# Patient Record
Sex: Male | Born: 1995 | Race: Black or African American | Hispanic: No | Marital: Single
Health system: Southern US, Community
[De-identification: ages and names within clinical notes are randomized; demographics above are authoritative.]

## PROBLEM LIST (undated history)

## (undated) DIAGNOSIS — E669 Obesity, unspecified: Secondary | ICD-10-CM

## (undated) DIAGNOSIS — J45909 Unspecified asthma, uncomplicated: Secondary | ICD-10-CM

---
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
## 2020-07-08 ENCOUNTER — Inpatient Hospital Stay (HOSPITAL_COMMUNITY)
Admission: EM | Admit: 2020-07-08 | Discharge: 2020-07-10 | DRG: 177 | Disposition: A | Discharge status: Home / Self Care | Payer: HRSA Program | Attending: Family Medicine | Admitting: Family Medicine

## 2020-07-08 ENCOUNTER — Emergency Department (HOSPITAL_COMMUNITY): Payer: HRSA Program

## 2020-07-08 ENCOUNTER — Encounter (HOSPITAL_COMMUNITY): Payer: Self-pay | Admitting: Emergency Medicine

## 2020-07-08 ENCOUNTER — Other Ambulatory Visit: Payer: Self-pay

## 2020-07-08 DIAGNOSIS — Z6841 Body Mass Index (BMI) 40.0 and over, adult: Secondary | ICD-10-CM

## 2020-07-08 DIAGNOSIS — R7989 Other specified abnormal findings of blood chemistry: Secondary | ICD-10-CM

## 2020-07-08 DIAGNOSIS — J9691 Respiratory failure, unspecified with hypoxia: Secondary | ICD-10-CM | POA: Diagnosis present

## 2020-07-08 DIAGNOSIS — U071 COVID-19: Secondary | ICD-10-CM | POA: Diagnosis not present

## 2020-07-08 DIAGNOSIS — E119 Type 2 diabetes mellitus without complications: Secondary | ICD-10-CM | POA: Diagnosis present

## 2020-07-08 DIAGNOSIS — R079 Chest pain, unspecified: Secondary | ICD-10-CM

## 2020-07-08 DIAGNOSIS — M79604 Pain in right leg: Secondary | ICD-10-CM

## 2020-07-08 DIAGNOSIS — I10 Essential (primary) hypertension: Secondary | ICD-10-CM | POA: Diagnosis present

## 2020-07-08 DIAGNOSIS — J40 Bronchitis, not specified as acute or chronic: Secondary | ICD-10-CM | POA: Diagnosis present

## 2020-07-08 DIAGNOSIS — R0902 Hypoxemia: Secondary | ICD-10-CM

## 2020-07-08 DIAGNOSIS — J45901 Unspecified asthma with (acute) exacerbation: Secondary | ICD-10-CM | POA: Diagnosis present

## 2020-07-08 DIAGNOSIS — E782 Mixed hyperlipidemia: Secondary | ICD-10-CM | POA: Diagnosis present

## 2020-07-08 DIAGNOSIS — J9601 Acute respiratory failure with hypoxia: Secondary | ICD-10-CM | POA: Diagnosis present

## 2020-07-08 DIAGNOSIS — Z8249 Family history of ischemic heart disease and other diseases of the circulatory system: Secondary | ICD-10-CM

## 2020-07-08 HISTORY — DX: Unspecified asthma, uncomplicated: J45.909

## 2020-07-08 HISTORY — DX: Obesity, unspecified: E66.9

## 2020-07-08 LAB — CBC WITH DIFFERENTIAL/PLATELET
Abs Immature Granulocytes: 0.08 10*3/uL — ABNORMAL HIGH (ref 0.00–0.07)
Basophils Absolute: 0 10*3/uL (ref 0.0–0.1)
Basophils Relative: 0 %
Eosinophils Absolute: 0.1 10*3/uL (ref 0.0–0.5)
Eosinophils Relative: 1 %
HCT: 41.8 % (ref 39.0–52.0)
Hemoglobin: 13.2 g/dL (ref 13.0–17.0)
Immature Granulocytes: 1 %
Lymphocytes Relative: 10 %
Lymphs Abs: 1 10*3/uL (ref 0.7–4.0)
MCH: 25.8 pg — ABNORMAL LOW (ref 26.0–34.0)
MCHC: 31.6 g/dL (ref 30.0–36.0)
MCV: 81.8 fL (ref 80.0–100.0)
Monocytes Absolute: 0.2 10*3/uL (ref 0.1–1.0)
Monocytes Relative: 2 %
Neutro Abs: 8.6 10*3/uL — ABNORMAL HIGH (ref 1.7–7.7)
Neutrophils Relative %: 86 %
Platelets: 352 10*3/uL (ref 150–400)
RBC: 5.11 MIL/uL (ref 4.22–5.81)
RDW: 15 % (ref 11.5–15.5)
WBC: 10.1 10*3/uL (ref 4.0–10.5)
nRBC: 0 % (ref 0.0–0.2)

## 2020-07-08 LAB — BASIC METABOLIC PANEL
Anion gap: 9 (ref 5–15)
BUN: 16 mg/dL (ref 6–20)
CO2: 23 mmol/L (ref 22–32)
Calcium: 9.7 mg/dL (ref 8.9–10.3)
Chloride: 100 mmol/L (ref 98–111)
Creatinine, Ser: 0.92 mg/dL (ref 0.61–1.24)
GFR, Estimated: >60 mL/min (ref >60)
Glucose, Bld: 134 mg/dL — ABNORMAL HIGH (ref 70–99)
Potassium: 4.1 mmol/L (ref 3.5–5.1)
Sodium: 132 mmol/L — ABNORMAL LOW (ref 135–145)

## 2020-07-08 LAB — RESPIRATORY PANEL BY RT PCR (FLU A&B, COVID)
Influenza A by PCR: NEGATIVE
Influenza B by PCR: NEGATIVE
SARS Coronavirus 2 by RT PCR: POSITIVE — AB

## 2020-07-08 LAB — TROPONIN I (HIGH SENSITIVITY)
Troponin I (High Sensitivity): <2 ng/L (ref <18)
Troponin I (High Sensitivity): <2 ng/L (ref <18)

## 2020-07-08 MED ORDER — MAGNESIUM SULFATE 2 GM/50ML IV SOLN
2.0000 g | Freq: Once | INTRAVENOUS | Status: AC
  Administered 2020-07-08: 2 g via INTRAVENOUS
  Filled 2020-07-08: qty 50

## 2020-07-08 MED ORDER — PREDNISONE 50 MG PO TABS
60.0000 mg | ORAL_TABLET | Freq: Once | ORAL | Status: AC
  Administered 2020-07-08: 60 mg via ORAL
  Filled 2020-07-08: qty 1

## 2020-07-08 MED ORDER — METHYLPREDNISOLONE SODIUM SUCC 125 MG IJ SOLR
125.0000 mg | Freq: Once | INTRAMUSCULAR | Status: AC
  Administered 2020-07-08: 125 mg via INTRAVENOUS
  Filled 2020-07-08: qty 2

## 2020-07-08 MED ORDER — ALBUTEROL SULFATE HFA 108 (90 BASE) MCG/ACT IN AERS
2.0000 | INHALATION_SPRAY | Freq: Once | RESPIRATORY_TRACT | Status: AC
  Administered 2020-07-08: 2 via RESPIRATORY_TRACT
  Filled 2020-07-08: qty 6.7

## 2020-07-08 MED ORDER — IOHEXOL 350 MG/ML SOLN
100.0000 mL | Freq: Once | INTRAVENOUS | Status: AC | PRN
  Administered 2020-07-08: 100 mL via INTRAVENOUS

## 2020-07-08 MED ORDER — AEROCHAMBER Z-STAT PLUS/MEDIUM MISC
Status: AC
  Filled 2020-07-08: qty 1

## 2020-07-08 NOTE — ED Triage Notes (Signed)
C/o chest congestion and flu-like symptoms.  Was positive for COVID October 3rd.  Pt has not been vaccinated.

## 2020-07-08 NOTE — ED Notes (Signed)
Date and time results received: 07/08/20  (use smartphrase ".now" to insert current time)  Test: covid Critical Value: positive  Name of Provider Notified: Debarah Crape, [place]   Orders Received? Or Actions Taken?:

## 2020-07-08 NOTE — ED Notes (Signed)
pts o2 sats 95% on RA at rest. Pt sats 96% on RA while ambulating.

## 2020-07-08 NOTE — ED Provider Notes (Signed)
[facility] EMERGENCY DEPARTMENT Provider Note   CSN: [id number] Arrival date & time: 07/08/20  1241     History Chief Complaint  Patient presents with  . Covid Positive    Ryan Marquez is a 24 y.o. male with history of elevated BMI 50, pediatric asthma presents to the ED for evaluation of chest pain.  Reports "lungs hurt".  Central. Began last night.  Constant.  Worse with taking deep breaths, coughing, moving, exertion.  Has associated runny nose, productive cough, shortness of breath, wheezing.  Feels some mucus occasionally building up in his throat.  Reports chronic shortness of breath that he attributes to his weight but feels like this is worse as well last night.  Had a hard time falling asleep because he felt like his breathing was heavier when he laid down.  No interventions.  Reports having COVID-19 the beginning of October.  Reports having all the Covid symptoms but then they resolved.  Felt like they came back again suddenly last night.  No sick contacts.  No travel.  Denies exertional chest pain.  No nausea, vomiting, abdominal pain, diarrhea.  Reports last time he had asthma issues was when he was in elementary school.  Stopped vaping a couple of weeks ago.  HPI     Past Medical History:  Diagnosis Date  . Asthma   . Obese     There are no problems to display for this patient.   ** The histories are not reviewed yet. Please review them in the "History" navigator section and refresh this SmartLink.     No family history on file.  Social History   Tobacco Use  . Smoking status: Not on file  Substance Use Topics  . Alcohol use: Not on file  . Drug use: Not on file    Home Medications Prior to Admission medications   Not on File    Allergies    Patient has no known allergies.  Review of Systems   Review of Systems  HENT: Positive for congestion and rhinorrhea.   Respiratory: Positive for cough, chest tightness, shortness of breath and wheezing.     Cardiovascular: Positive for chest pain.  All other systems reviewed and are negative.   Physical Exam Updated Vital Signs BP (!) 152/95   Pulse 94   Temp 98.8 F (37.1 C) (Oral)   Resp 20   Ht 5\' 8"  (1.727 m)   Wt (!) 149.7 kg   SpO2 90%   BMI 50.18 kg/m   Physical Exam Vitals and nursing note reviewed.  Constitutional:      General: He is not in acute distress.    Appearance: He is well-developed.     Comments: NAD.  HENT:     Head: Normocephalic and atraumatic.     Right Ear: External ear normal.     Left Ear: External ear normal.     Nose: Congestion and rhinorrhea present.  Eyes:     General: No scleral icterus.    Conjunctiva/sclera: Conjunctivae normal.  Cardiovascular:     Rate and Rhythm: Normal rate and regular rhythm.     Heart sounds: Normal heart sounds. No murmur heard.   Pulmonary:     Effort: Pulmonary effort is normal.     Breath sounds: Wheezing present.     Comments: Normal work of breathing.  Speaking in full sentences.  Wheezing in upper and middle lobes.  Difficult exam in lower lobes given body habitus.  No crackles. Abdominal:  Palpations: Abdomen is soft.     Tenderness: There is no abdominal tenderness.  Musculoskeletal:        General: Normal range of motion.     Cervical back: Normal range of motion and neck supple.  Skin:    General: Skin is warm and dry.     Capillary Refill: Capillary refill takes less than 2 seconds.  Neurological:     Mental Status: He is alert and oriented to person, place, and time.  Psychiatric:        Behavior: Behavior normal.        Thought Content: Thought content normal.        Judgment: Judgment normal.     ED Results / Procedures / Treatments   Labs (all labs ordered are listed, but only abnormal results are displayed) Labs Reviewed  RESPIRATORY PANEL BY RT PCR (FLU A&B, COVID) - Abnormal; Notable for the following components:      Result Value   SARS Coronavirus 2 by RT PCR POSITIVE (*)     All other components within normal limits  CBC WITH DIFFERENTIAL/PLATELET - Abnormal; Notable for the following components:   MCH 25.8 (*)    Neutro Abs 8.6 (*)    Abs Immature Granulocytes 0.08 (*)    All other components within normal limits  BASIC METABOLIC PANEL - Abnormal; Notable for the following components:   Sodium 132 (*)    Glucose, Bld 134 (*)    All other components within normal limits  TROPONIN I (HIGH SENSITIVITY)  TROPONIN I (HIGH SENSITIVITY)    EKG None  Radiology DG Chest 2 View  Result Date: 07/08/2020 CLINICAL DATA:  Cough and chest congestion. Previous coronavirus infection. EXAM: CHEST - 2 VIEW COMPARISON:  06/05/2008 FINDINGS: Heart size upper limits of normal. Mediastinal shadows are normal. Mild atelectasis or scarring at the right lung base. Bronchial thickening pattern suggesting bronchitis. No consolidation or lobar collapse. No effusions. No significant finding. IMPRESSION: Bronchial thickening pattern suggesting bronchitis. Mild atelectasis or scarring at the right lung base. Electronically Signed   By: Paulina FusiMark  Shogry M.D.   On: 07/08/2020 14:19   CT Angio Chest PE W and/or Wo Contrast  Result Date: 07/08/2020 CLINICAL DATA:  Tachycardia, cough, COVID-19 diagnosed 05/31/2020 EXAM: CT ANGIOGRAPHY CHEST WITH CONTRAST TECHNIQUE: Multidetector CT imaging of the chest was performed using the standard protocol during bolus administration of intravenous contrast. Multiplanar CT image reconstructions and MIPs were obtained to evaluate the vascular anatomy. CONTRAST:  100mL OMNIPAQUE IOHEXOL 350 MG/ML SOLN COMPARISON:  07/08/2020 FINDINGS: Cardiovascular: This is a technically suboptimal evaluation of the pulmonary vasculature due to poor intravenous access and limitations with contrast bolus. Pulmonary emboli cannot be excluded. The heart is unremarkable without pericardial effusion. Normal caliber of the thoracic aorta. Mediastinum/Nodes: No enlarged mediastinal,  hilar, or axillary lymph nodes. Thyroid gland, trachea, and esophagus demonstrate no significant findings. Lungs/Pleura: No airspace disease, effusion, or pneumothorax. Mild bronchial wall thickening. Central airways are patent. Upper Abdomen: No acute abnormality. Musculoskeletal: No acute or destructive bony lesions. Reconstructed images demonstrate no additional findings. Review of the MIP images confirms the above findings. IMPRESSION: 1. Nondiagnostic evaluation of the pulmonary arteries due to limitations of contrast bolus. Pulmonary emboli cannot be excluded. 2. Mild bilateral bronchial wall thickening consistent with reactive airway disease. No acute airspace disease. Electronically Signed   By: Sharlet SalinaMichael  Brown M.D.   On: 07/08/2020 22:51    Procedures .Critical Care Performed by: Liberty HandyGibbons, Emeli Goguen J, PA-C Authorized by: Margarette AsalGibbons,  Delia Heady, PA-C   Critical care provider statement:    Critical care time (minutes):  45   Critical care was necessary to treat or prevent imminent or life-threatening deterioration of the following conditions:  Respiratory failure   Critical care was time spent personally by me on the following activities:  Discussions with consultants, evaluation of patient's response to treatment, examination of patient, ordering and performing treatments and interventions, ordering and review of laboratory studies, ordering and review of radiographic studies, pulse oximetry, re-evaluation of patient's condition, obtaining history from patient or surrogate, review of old charts and development of treatment plan with patient or surrogate   I assumed direction of critical care for this patient from another provider in my specialty: no     (including critical care time)  Medications Ordered in ED Medications  aerochamber Z-Stat Plus/medium (  Not Given 07/08/20 1840)  albuterol (VENTOLIN HFA) 108 (90 Base) MCG/ACT inhaler 2 puff (2 puffs Inhalation Given 07/08/20 1522)  predniSONE  (DELTASONE) tablet 60 mg (60 mg Oral Given 07/08/20 1520)  methylPREDNISolone sodium succinate (SOLU-MEDROL) 125 mg/2 mL injection 125 mg (125 mg Intravenous Given 07/08/20 1831)  magnesium sulfate IVPB 2 g 50 mL (0 g Intravenous Stopped 07/08/20 1929)  iohexol (OMNIPAQUE) 350 MG/ML injection 100 mL (100 mLs Intravenous Contrast Given 07/08/20 2207)    ED Course  I have reviewed the triage vital signs and the nursing notes.  Pertinent labs & imaging results that were available during my care of the patient were reviewed by me and considered in my medical decision making (see chart for details).  Clinical Course as of Jul 08 2312  Wed Jul 08, 2020  1444 IMPRESSION: Bronchial thickening pattern suggesting bronchitis. Mild atelectasis or scarring at the right lung base.  DG Chest 2 View [CG]  1659 Reevaluated patient after albuterol treatment, prednisone.  Reports continued chest pain.  Patient was 88% on room air at rest/laying down.  I ambulated him for 1 minute and his heart rate went up to 126, SPO2 dropped to 88% but quickly improved after rest to greater than 94%.  Patient reports feeling lightheaded.  Given continued chest pain, exertional tachycardia, transient hypoxia will start lab work, get CTA   [CG]  2306 IMPRESSION: 1. Nondiagnostic evaluation of the pulmonary arteries due to limitations of contrast bolus. Pulmonary emboli cannot be excluded. 2. Mild bilateral bronchial wall thickening consistent with reactive airway disease. No acute airspace disease.  CT Angio Chest PE W and/or Wo Contrast [CG]    Clinical Course User Index [CG] Jerrell Mylar   MDM Rules/Calculators/A&P                          24 year old male with history of pediatric asthma, BMI 50 and reported Covid diagnosis first week of October here for upper respiratory symptoms as well as central chest pain worse with deep breathing, coughing, moving, wheezing.  Reports Covid symptoms had completely  resolved last month.  Symptoms returned. No history of blood clots.  Has not had an asthma flare since elementary school.  On exam he has very faint wheezing, tachycardia 110s.  Afebrile.   ER work-up initiated in triage including chest x-ray and respiratory panel.    Chest x-ray shows bronchial thickening suggesting bronchitis with possible atelectasis or scarring in the right lung base.  Respiratory panel positive for Covid which was anticipated given diagnosis just last month.  Patient given albuterol inhaler, prednisone.  Will reassess, ambulate with pulse ox .  No clinical improvement after meds.  I personally ambulated patient and his heart rate went up to 126 and he had very brief transient hypoxia 88% at the end of 1 minute walk, this quickly improved after rest however patient felt very lightheaded.  Tachypneic in the mid 20s.  Wheezing did improve.  Given tachycardia, transient hypoxia, tachypnea and upper respiratory symptoms with chest pain that is pleuritic, will now add lab work.  Will obtain CT A to rule out a small pneumonia missed on chest x-ray, PE.  Labs ordered: CBC, BMP, troponin.  Imaging ordered: CTA, EKG  Meds ordered: solumedrol, mag, albuterol  ER work up personally visualized and interpreted  Labs reviewed - No leukocytosis, anemia.  Normal electrolytes. +SARS. Trop undetectable, pending delta.   Imaging reviewed- EKG with sinus tachycardia HR 112, possible LVH but no ischemia, arrhythmia, RV strain.   1915: RN notified me patient dropped down to 86% on RA placed on 2 L  with improvement in work of breathing. Pending labs, CTA.   2308: Significant delay in work-up in the ED due to several failed IV attempts, infiltrated during CTA as well and fifth ultrasound IV placed by RN finally.  Unfortunately timing of contract is poor.  CTA limited due to contrast timing, shows reactive airway disease but no large PE, infiltrate.  Clinically patient presents with  cough that is productive, runny nose, wheezing, chest pain that is pleuritic and posttussive.  Wheezing on my initial exam but improved with breathing treatments, prednisone, magnesium.  Initially tachycardic, tachypneic and hypoxic 86% on room air, stable now on 2 LNC with no clinical decline. Highest on ddx is viral illness leading to RAD/asthma flare.  No fever, leukocytosis at this time will defer antibiotics. Will request admission.   Final Clinical Impression(s) / ED Diagnoses Final diagnoses:  Hypoxia    Rx / DC Orders ED Discharge Orders    None       Liberty Handy, PA-C 07/08/20 2313    Gerhard Munch, MD 07/10/20 [phone]

## 2020-07-08 NOTE — ED Notes (Signed)
Pt placed on 2L [place]

## 2020-07-09 ENCOUNTER — Observation Stay (HOSPITAL_COMMUNITY): Payer: HRSA Program

## 2020-07-09 ENCOUNTER — Observation Stay: Payer: Self-pay

## 2020-07-09 DIAGNOSIS — R0902 Hypoxemia: Secondary | ICD-10-CM

## 2020-07-09 DIAGNOSIS — E119 Type 2 diabetes mellitus without complications: Secondary | ICD-10-CM

## 2020-07-09 DIAGNOSIS — J45901 Unspecified asthma with (acute) exacerbation: Secondary | ICD-10-CM | POA: Diagnosis present

## 2020-07-09 DIAGNOSIS — R7989 Other specified abnormal findings of blood chemistry: Secondary | ICD-10-CM | POA: Diagnosis not present

## 2020-07-09 DIAGNOSIS — I10 Essential (primary) hypertension: Secondary | ICD-10-CM | POA: Diagnosis present

## 2020-07-09 DIAGNOSIS — R079 Chest pain, unspecified: Secondary | ICD-10-CM | POA: Diagnosis present

## 2020-07-09 DIAGNOSIS — U071 COVID-19: Secondary | ICD-10-CM | POA: Diagnosis present

## 2020-07-09 DIAGNOSIS — J9601 Acute respiratory failure with hypoxia: Secondary | ICD-10-CM | POA: Diagnosis present

## 2020-07-09 DIAGNOSIS — Z8249 Family history of ischemic heart disease and other diseases of the circulatory system: Secondary | ICD-10-CM | POA: Diagnosis not present

## 2020-07-09 DIAGNOSIS — Z6841 Body Mass Index (BMI) 40.0 and over, adult: Secondary | ICD-10-CM | POA: Diagnosis not present

## 2020-07-09 DIAGNOSIS — J40 Bronchitis, not specified as acute or chronic: Secondary | ICD-10-CM | POA: Diagnosis present

## 2020-07-09 DIAGNOSIS — E782 Mixed hyperlipidemia: Secondary | ICD-10-CM | POA: Diagnosis present

## 2020-07-09 LAB — COMPREHENSIVE METABOLIC PANEL
ALT: 32 U/L (ref 0–44)
AST: 23 U/L (ref 15–41)
Albumin: 4.3 g/dL (ref 3.5–5.0)
Alkaline Phosphatase: 83 U/L (ref 38–126)
Anion gap: 12 (ref 5–15)
BUN: 19 mg/dL (ref 6–20)
CO2: 22 mmol/L (ref 22–32)
Calcium: 9.6 mg/dL (ref 8.9–10.3)
Chloride: 100 mmol/L (ref 98–111)
Creatinine, Ser: 0.99 mg/dL (ref 0.61–1.24)
GFR, Estimated: >60 mL/min (ref >60)
Glucose, Bld: 233 mg/dL — ABNORMAL HIGH (ref 70–99)
Potassium: 4.7 mmol/L (ref 3.5–5.1)
Sodium: 134 mmol/L — ABNORMAL LOW (ref 135–145)
Total Bilirubin: 0.5 mg/dL (ref 0.3–1.2)
Total Protein: 8.6 g/dL — ABNORMAL HIGH (ref 6.5–8.1)

## 2020-07-09 LAB — GLUCOSE, CAPILLARY
Glucose-Capillary: 153 mg/dL — ABNORMAL HIGH (ref 70–99)
Glucose-Capillary: 154 mg/dL — ABNORMAL HIGH (ref 70–99)
Glucose-Capillary: 157 mg/dL — ABNORMAL HIGH (ref 70–99)
Glucose-Capillary: 139 mg/dL — ABNORMAL HIGH (ref 70–99)

## 2020-07-09 LAB — HIV ANTIBODY (ROUTINE TESTING W REFLEX): HIV Screen 4th Generation wRfx: NONREACTIVE

## 2020-07-09 LAB — CBC WITH DIFFERENTIAL/PLATELET
Abs Immature Granulocytes: 0.06 10*3/uL (ref 0.00–0.07)
Basophils Absolute: 0 10*3/uL (ref 0.0–0.1)
Basophils Relative: 0 %
Eosinophils Absolute: 0 10*3/uL (ref 0.0–0.5)
Eosinophils Relative: 0 %
HCT: 40.5 % (ref 39.0–52.0)
Hemoglobin: 12.8 g/dL — ABNORMAL LOW (ref 13.0–17.0)
Immature Granulocytes: 1 %
Lymphocytes Relative: 11 %
Lymphs Abs: 1.2 10*3/uL (ref 0.7–4.0)
MCH: 26.2 pg (ref 26.0–34.0)
MCHC: 31.6 g/dL (ref 30.0–36.0)
MCV: 82.8 fL (ref 80.0–100.0)
Monocytes Absolute: 0.3 10*3/uL (ref 0.1–1.0)
Monocytes Relative: 2 %
Neutro Abs: 9.2 10*3/uL — ABNORMAL HIGH (ref 1.7–7.7)
Neutrophils Relative %: 86 %
Platelets: 368 10*3/uL (ref 150–400)
RBC: 4.89 MIL/uL (ref 4.22–5.81)
RDW: 14.9 % (ref 11.5–15.5)
WBC: 10.7 10*3/uL — ABNORMAL HIGH (ref 4.0–10.5)
nRBC: 0 % (ref 0.0–0.2)

## 2020-07-09 LAB — HEMOGLOBIN A1C
Hgb A1c MFr Bld: 7 % — ABNORMAL HIGH (ref 4.8–5.6)
Mean Plasma Glucose: 154.2 mg/dL

## 2020-07-09 LAB — MAGNESIUM: Magnesium: 2 mg/dL (ref 1.7–2.4)

## 2020-07-09 LAB — D-DIMER, QUANTITATIVE: D-Dimer, Quant: 0.37 ug/mL-FEU (ref 0.00–0.50)

## 2020-07-09 MED ORDER — ENOXAPARIN SODIUM 80 MG/0.8ML ~~LOC~~ SOLN
75.0000 mg | SUBCUTANEOUS | Status: DC
  Administered 2020-07-09 – 2020-07-10 (×2): 75 mg via SUBCUTANEOUS
  Filled 2020-07-09 (×3): qty 0.8

## 2020-07-09 MED ORDER — INSULIN ASPART 100 UNIT/ML ~~LOC~~ SOLN
6.0000 [IU] | Freq: Three times a day (TID) | SUBCUTANEOUS | Status: DC
  Administered 2020-07-09 – 2020-07-10 (×5): 6 [IU] via SUBCUTANEOUS

## 2020-07-09 MED ORDER — ONDANSETRON HCL 4 MG/2ML IJ SOLN: 4.0000 mg | Freq: Four times a day (QID) | INTRAMUSCULAR | Status: DC | PRN

## 2020-07-09 MED ORDER — METHYLPREDNISOLONE SODIUM SUCC 40 MG IJ SOLR
40.0000 mg | Freq: Two times a day (BID) | INTRAMUSCULAR | Status: DC
  Administered 2020-07-09 – 2020-07-10 (×3): 40 mg via INTRAVENOUS
  Filled 2020-07-09 (×3): qty 1

## 2020-07-09 MED ORDER — LABETALOL HCL 5 MG/ML IV SOLN: 10.0000 mg | INTRAVENOUS | Status: DC | PRN

## 2020-07-09 MED ORDER — AMLODIPINE BESYLATE 5 MG PO TABS
5.0000 mg | ORAL_TABLET | Freq: Every day | ORAL | Status: DC
  Administered 2020-07-09 – 2020-07-10 (×2): 5 mg via ORAL
  Filled 2020-07-09 (×2): qty 1

## 2020-07-09 MED ORDER — OXYCODONE HCL 5 MG PO TABS: 5.0000 mg | ORAL_TABLET | ORAL | Status: DC | PRN

## 2020-07-09 MED ORDER — SODIUM CHLORIDE 0.9% FLUSH: 10.0000 mL | INTRAVENOUS | Status: DC | PRN

## 2020-07-09 MED ORDER — SODIUM CHLORIDE 0.9% FLUSH
10.0000 mL | Freq: Two times a day (BID) | INTRAVENOUS | Status: DC
  Administered 2020-07-09 – 2020-07-10 (×3): 10 mL

## 2020-07-09 MED ORDER — ALBUTEROL SULFATE HFA 108 (90 BASE) MCG/ACT IN AERS
2.0000 | INHALATION_SPRAY | RESPIRATORY_TRACT | Status: DC | PRN
  Administered 2020-07-09 (×2): 2 via RESPIRATORY_TRACT
  Filled 2020-07-09: qty 6.7

## 2020-07-09 MED ORDER — INSULIN ASPART 100 UNIT/ML ~~LOC~~ SOLN
0.0000 [IU] | Freq: Three times a day (TID) | SUBCUTANEOUS | Status: DC
  Administered 2020-07-09 (×2): 4 [IU] via SUBCUTANEOUS
  Administered 2020-07-10: 3 [IU] via SUBCUTANEOUS
  Administered 2020-07-10: 4 [IU] via SUBCUTANEOUS

## 2020-07-09 MED ORDER — PREDNISONE 20 MG PO TABS
40.0000 mg | ORAL_TABLET | Freq: Every day | ORAL | Status: DC
  Filled 2020-07-09: qty 2

## 2020-07-09 MED ORDER — METFORMIN HCL ER 500 MG PO TB24
500.0000 mg | ORAL_TABLET | Freq: Every day | ORAL | Status: DC
  Administered 2020-07-09: 500 mg via ORAL
  Filled 2020-07-09: qty 1

## 2020-07-09 MED ORDER — INSULIN ASPART 100 UNIT/ML ~~LOC~~ SOLN: 0.0000 [IU] | Freq: Every day | SUBCUTANEOUS | Status: DC

## 2020-07-09 MED ORDER — LIVING WELL WITH DIABETES BOOK: Freq: Once | Status: AC

## 2020-07-09 MED ORDER — AZITHROMYCIN 250 MG PO TABS
500.0000 mg | ORAL_TABLET | Freq: Every day | ORAL | Status: DC
  Administered 2020-07-10: 500 mg via ORAL
  Filled 2020-07-09: qty 2

## 2020-07-09 MED ORDER — ACETAMINOPHEN 325 MG PO TABS: 650.0000 mg | ORAL_TABLET | Freq: Four times a day (QID) | ORAL | Status: DC | PRN

## 2020-07-09 MED ORDER — SODIUM CHLORIDE 0.9 % IV SOLN
500.0000 mg | INTRAVENOUS | Status: AC
  Administered 2020-07-09: 500 mg via INTRAVENOUS
  Filled 2020-07-09: qty 500

## 2020-07-09 MED ORDER — ACETAMINOPHEN 650 MG RE SUPP: 650.0000 mg | Freq: Four times a day (QID) | RECTAL | Status: DC | PRN

## 2020-07-09 MED ORDER — ONDANSETRON HCL 4 MG PO TABS: 4.0000 mg | ORAL_TABLET | Freq: Four times a day (QID) | ORAL | Status: DC | PRN

## 2020-07-09 NOTE — Progress Notes (Signed)
Right upper arm 20 gauge IV removed by patient during sleep. Future meds scheduled po. Prev IV was by ultrasound guided after multiple attempts in ED.

## 2020-07-09 NOTE — Progress Notes (Addendum)
ASSUMPTION OF CARE NOTE   07/09/2020 4:26 PM  Ryan Marquez was seen and examined.  The H&P by the admitting provider, orders, imaging was reviewed.  Please see new orders.  Will continue to follow.    Morbid/Severe obesity  - BMI > 40.0  Vitals:   07/09/20 0501 07/09/20 1422  BP: (!) 146/112 139/72  Pulse: 99 93  Resp: 20 18  Temp: 98.1 F (36.7 C) 98.2 F (36.8 C)  SpO2: 94% 96%    Results for orders placed or performed during the hospital encounter of 07/08/20  Respiratory Panel by RT PCR (Flu A&B, Covid) - Nasopharyngeal Swab   Specimen: Nasopharyngeal Swab  Result Value Ref Range   SARS Coronavirus 2 by RT PCR POSITIVE (A) NEGATIVE   Influenza A by PCR NEGATIVE NEGATIVE   Influenza B by PCR NEGATIVE NEGATIVE  CBC with Differential  Result Value Ref Range   WBC 10.1 4.0 - 10.5 K/uL   RBC 5.11 4.22 - 5.81 MIL/uL   Hemoglobin 13.2 13.0 - 17.0 g/dL   HCT 38.1 39 - 52 %   MCV 81.8 80.0 - 100.0 fL   MCH 25.8 (L) 26.0 - 34.0 pg   MCHC 31.6 30.0 - 36.0 g/dL   RDW 82.9 93.7 - 16.9 %   Platelets 352 150 - 400 K/uL   nRBC 0.0 0.0 - 0.2 %   Neutrophils Relative % 86 %   Neutro Abs 8.6 (H) 1.7 - 7.7 K/uL   Lymphocytes Relative 10 %   Lymphs Abs 1.0 0.7 - 4.0 K/uL   Monocytes Relative 2 %   Monocytes Absolute 0.2 0.1 - 1.0 K/uL   Eosinophils Relative 1 %   Eosinophils Absolute 0.1 0.0 - 0.5 K/uL   Basophils Relative 0 %   Basophils Absolute 0.0 0.0 - 0.1 K/uL   Immature Granulocytes 1 %   Abs Immature Granulocytes 0.08 (H) 0.00 - 0.07 K/uL  Basic metabolic panel  Result Value Ref Range   Sodium 132 (L) 135 - 145 mmol/L   Potassium 4.1 3.5 - 5.1 mmol/L   Chloride 100 98 - 111 mmol/L   CO2 23 22 - 32 mmol/L   Glucose, Bld 134 (H) 70 - 99 mg/dL   BUN 16 6 - 20 mg/dL   Creatinine, Ser 6.78 0.61 - 1.24 mg/dL   Calcium 9.7 8.9 - 93.8 mg/dL   GFR, Estimated >10 >17 mL/min   Anion gap 9 5 - 15  HIV Antibody (routine testing w rflx)  Result Value Ref Range   HIV  Screen 4th Generation wRfx Non Reactive Non Reactive  Comprehensive metabolic panel  Result Value Ref Range   Sodium 134 (L) 135 - 145 mmol/L   Potassium 4.7 3.5 - 5.1 mmol/L   Chloride 100 98 - 111 mmol/L   CO2 22 22 - 32 mmol/L   Glucose, Bld 233 (H) 70 - 99 mg/dL   BUN 19 6 - 20 mg/dL   Creatinine, Ser 5.10 0.61 - 1.24 mg/dL   Calcium 9.6 8.9 - 25.8 mg/dL   Total Protein 8.6 (H) 6.5 - 8.1 g/dL   Albumin 4.3 3.5 - 5.0 g/dL   AST 23 15 - 41 U/L   ALT 32 0 - 44 U/L   Alkaline Phosphatase 83 38 - 126 U/L   Total Bilirubin 0.5 0.3 - 1.2 mg/dL   GFR, Estimated >52 >77 mL/min   Anion gap 12 5 - 15  Magnesium  Result Value Ref Range   Magnesium  2.0 1.7 - 2.4 mg/dL  CBC WITH DIFFERENTIAL  Result Value Ref Range   WBC 10.7 (H) 4.0 - 10.5 K/uL   RBC 4.89 4.22 - 5.81 MIL/uL   Hemoglobin 12.8 (L) 13.0 - 17.0 g/dL   HCT 41.3 39 - 52 %   MCV 82.8 80.0 - 100.0 fL   MCH 26.2 26.0 - 34.0 pg   MCHC 31.6 30.0 - 36.0 g/dL   RDW 24.4 01.0 - 27.2 %   Platelets 368 150 - 400 K/uL   nRBC 0.0 0.0 - 0.2 %   Neutrophils Relative % 86 %   Neutro Abs 9.2 (H) 1.7 - 7.7 K/uL   Lymphocytes Relative 11 %   Lymphs Abs 1.2 0.7 - 4.0 K/uL   Monocytes Relative 2 %   Monocytes Absolute 0.3 0.1 - 1.0 K/uL   Eosinophils Relative 0 %   Eosinophils Absolute 0.0 0.0 - 0.5 K/uL   Basophils Relative 0 %   Basophils Absolute 0.0 0.0 - 0.1 K/uL   Immature Granulocytes 1 %   Abs Immature Granulocytes 0.06 0.00 - 0.07 K/uL  D-dimer, quantitative (not at [facility])  Result Value Ref Range   D-Dimer, Quant 0.37 0.00 - 0.50 ug/mL-FEU  Hemoglobin A1c  Result Value Ref Range   Hgb A1c MFr Bld 7.0 (H) 4.8 - 5.6 %   Mean Plasma Glucose 154.2 mg/dL  Glucose, capillary  Result Value Ref Range   Glucose-Capillary 153 (H) 70 - 99 mg/dL  Glucose, capillary  Result Value Ref Range   Glucose-Capillary 154 (H) 70 - 99 mg/dL  Glucose, capillary  Result Value Ref Range   Glucose-Capillary 157 (H) 70 - 99 mg/dL  Troponin I  (High Sensitivity)  Result Value Ref Range   Troponin I (High Sensitivity) <2 <18 ng/L  Troponin I (High Sensitivity)  Result Value Ref Range   Troponin I (High Sensitivity) <2 <18 ng/L   C. Laural Benes, MD [facility]   07/08/2020  2:27 PM How to contact the [facility] Attending or Consulting provider 7A - 7P or covering provider during after hours 7P -7A, for this patient?  Check the care team in [facility] and look for a) attending/consulting [facility] provider listed and b) the [facility] team listed Log into [email or web address] and use [place]'s universal password to access. If you do not have the password, please contact the hospital operator. Locate the [facility] provider you are looking for under [facility] and page to a number that you can be directly reached. If you still have difficulty reaching the provider, please page the [facility] (Director on Call) for the Hospitalists listed on amion for assistance.

## 2020-07-09 NOTE — H&P (Signed)
TRH H&P    Patient Demographics:    Ryan Marquez, is a 24 y.o. male  MRN: [id number]  DOB - 06-03-1996  Admit Date - 07/08/2020  Referring MD/NP/PA: Debarah Crape  Outpatient Primary MD for the patient is Patient, No Pcp Per  Patient coming from: Home  Chief complaint-chest pain   HPI:    Ryan Marquez  is a 24 y.o. male, with history of asthma and obesity presents to the ED with a chief complaint of chest pain.  Was sudden in onset yesterday.  Reports it was initially a 10 out of 10, now it is comfortable.  The pain is described as a squeezing pressure-like pain.  Coughing makes it worse.  Pain is nonexertional.  It is better with sleep.  Patient reports when he coughs he is productive of clear sputum.  Cough started yesterday.  He had a cough when he was initially diagnosed with COVID-19, but this cough is different than that one per his report.  Patient has not had any fevers or palpitations.  On review of systems he does admit to "scratchy" ear pain 2 days ago.  He notes that he was wheezing when he came into the ED.  Is a history of pediatric asthma, but reports that his mother never got his inhalers refilled, so he does not have any inhalers to try at home.  Patient reports no new exposures at home.  Patient reports to feeling much better after treatment in the ER.  In the ED Temperature 98.8, heart rate 94 -112, respiratory rate 15-32, blood pressure 152/95 Leukocytosis 10.1 CHEM panel is unremarkable Troponins are undetectable x2 CT angio was nondiagnostic eval for pulmonary and, but did show bronchial wall thickening indicative of reactive airway EKG shows a heart rate of 112, QTc 434, sinus tach Chest x-ray shows bronchitis Albuterol, mag, Solu-Medrol, prednisone given in the ED -improvement of symptoms with this treatment Covid positive Patient was ambulated in the ED with oxygen saturations dropping  into the mid high 80s Admission requested for respiratory failure    Review of systems:    In addition to the HPI above,  No Fever-chills, No Headache, No changes with Vision or hearing, No problems swallowing food or Liquids, Positive for chest pain, positive for cough positive for shortness of Breath, No Abdominal pain, No Nausea or Vomiting, bowel movements are regular, No Blood in stool or Urine, No dysuria, No new skin rashes or bruises, No new joints pains-aches,  No new weakness, tingling, numbness in any extremity, No recent weight gain or loss, No polyuria, polydypsia or polyphagia, No significant Mental Stressors.  All other systems reviewed and are negative.    Past History of the following :    Past Medical History:  Diagnosis Date  . Asthma   . Obese           Social History:      Social History   Tobacco Use  . Smoking status: Not on file  Substance Use Topics  . Alcohol use: Not on file  Family History :    No family history on file. Family history of hypertension   Home Medications:   Prior to Admission medications   Not on File     Allergies:    No Known Allergies   Physical Exam:   Vitals  Blood pressure (!) 147/107, pulse (!) 102, temperature 98.5 F (36.9 C), resp. rate 20, height 5\' 8"  (1.727 m), weight (!) 149.7 kg, SpO2 92 %.  1.  General: Patient sitting up in bed eating dinner tray  2. Psychiatric: Mood and behavior normal for situation, cooperative with exam  3. Neurologic: Cranial nerves II through XII are intact, moves all 4 extremities voluntarily, equal sensation in the lower and upper extremities bilaterally, no focal deficit on limited exam  4. HEENMT:  Head is atraumatic, normocephalic, pupils reactive to light, neck is supple, trachea is midline, mucous membranes are moist  5. Respiratory : Lungs are clear to auscultation at this time, without wheeze, rhonchi, rales No cyanosis or  clubbing  6. Cardiovascular : Heart rate is tachycardic, rhythm is regular, no murmurs rubs or gallops, peripheral pulses palpated  7. Gastrointestinal:  Abdomen is soft, nondistended, nontender to palpation and obese  8. Skin:  No acute lesions on limited skin exam  9.Musculoskeletal:  No acute deformity, no peripheral edema    Data Review:    CBC Recent Labs  Lab 07/08/20 2035  WBC 10.1  HGB 13.2  HCT 41.8  PLT 352  MCV 81.8  MCH 25.8*  MCHC 31.6  RDW 15.0  LYMPHSABS 1.0  MONOABS 0.2  EOSABS 0.1  BASOSABS 0.0   ------------------------------------------------------------------------------------------------------------------  Results for orders placed or performed during the hospital encounter of 07/08/20 (from the past 48 hour(s))  Respiratory Panel by RT PCR (Flu A&B, Covid) - Nasopharyngeal Swab     Status: Abnormal   Collection Time: 07/08/20  1:35 PM   Specimen: Nasopharyngeal Swab  Result Value Ref Range   SARS Coronavirus 2 by RT PCR POSITIVE (A) NEGATIVE    Comment: RESULT CALLED TO, READ BACK BY AND VERIFIED WITH: GANT,E. AT 1505 BY HUFFINES,S ON 07/08/20. (NOTE) SARS-CoV-2 target nucleic acids are DETECTED.  SARS-CoV-2 RNA is generally detectable in upper respiratory specimens  during the acute phase of infection. Positive results are indicative of the presence of the identified virus, but do not rule out bacterial infection or co-infection with other pathogens not detected by the test. Clinical correlation with patient history and other diagnostic information is necessary to determine patient infection status. The expected result is Negative.  Fact Sheet for Patients:  13/10/21  Fact Sheet for Healthcare Providers: https://www.moore.com/  This test is not yet approved or cleared by the https://www.young.biz/ FDA and  has been authorized for detection and/or diagnosis of SARS-CoV-2 by FDA under an  Emergency Use Authorization (EUA).  This EUA will remain in effect (meaning this test  can be used) for the duration of  the COVID-19 declaration under Section 564(b)(1) of the Act, 21 U.S.C. section 360bbb-3(b)(1), unless the authorization is terminated or revoked sooner.      Influenza A by PCR NEGATIVE NEGATIVE   Influenza B by PCR NEGATIVE NEGATIVE    Comment: (NOTE) The Xpert Xpress SARS-CoV-2/FLU/RSV assay is intended as an aid in  the diagnosis of influenza from Nasopharyngeal swab specimens and  should not be used as a sole basis for treatment. Nasal washings and  aspirates are unacceptable for Xpert Xpress SARS-CoV-2/FLU/RSV  testing.  Fact Sheet for Patients: [place]  Fact Sheet for Healthcare Providers: https://www.young.biz/  This test is not yet approved or cleared by the [place] FDA and  has been authorized for detection and/or diagnosis of SARS-CoV-2 by  FDA under an Emergency Use Authorization (EUA). This EUA will remain  in effect (meaning this test can be used) for the duration of the  Covid-19 declaration under Section 564(b)(1) of the Act, 21  U.S.C. section 360bbb-3(b)(1), unless the authorization is  terminated or revoked. Performed at [facility], [address] [place], [place] [address]   CBC with Differential     Status: Abnormal   Collection Time: 07/08/20  8:35 PM  Result Value Ref Range   WBC 10.1 4.0 - 10.5 K/uL   RBC 5.11 4.22 - 5.81 MIL/uL   Hemoglobin 13.2 13.0 - 17.0 g/dL   HCT 03.8 39 - 52 %   MCV 81.8 80.0 - 100.0 fL   MCH 25.8 (L) 26.0 - 34.0 pg   MCHC 31.6 30.0 - 36.0 g/dL   RDW 88.2 80.0 - 34.9 %   Platelets 352 150 - 400 K/uL   nRBC 0.0 0.0 - 0.2 %   Neutrophils Relative % 86 %   Neutro Abs 8.6 (H) 1.7 - 7.7 K/uL   Lymphocytes Relative 10 %   Lymphs Abs 1.0 0.7 - 4.0 K/uL   Monocytes Relative 2 %   Monocytes Absolute 0.2 0.1 - 1.0 K/uL   Eosinophils Relative 1 %    Eosinophils Absolute 0.1 0.0 - 0.5 K/uL   Basophils Relative 0 %   Basophils Absolute 0.0 0.0 - 0.1 K/uL   Immature Granulocytes 1 %   Abs Immature Granulocytes 0.08 (H) 0.00 - 0.07 K/uL    Comment: Performed at [facility], [address], [place], [place] [address]  Basic metabolic panel     Status: Abnormal   Collection Time: 07/08/20  8:35 PM  Result Value Ref Range   Sodium 132 (L) 135 - 145 mmol/L   Potassium 4.1 3.5 - 5.1 mmol/L   Chloride 100 98 - 111 mmol/L   CO2 23 22 - 32 mmol/L   Glucose, Bld 134 (H) 70 - 99 mg/dL    Comment: Glucose reference range applies only to samples taken after fasting for at least 8 hours.   BUN 16 6 - 20 mg/dL   Creatinine, Ser 0.56 0.61 - 1.24 mg/dL   Calcium 9.7 8.9 - 97.9 mg/dL   GFR, Estimated >48 >01 mL/min    Comment: (NOTE) Calculated using the CKD-EPI Creatinine Equation (2021)    Anion gap 9 5 - 15    Comment: Performed at [facility], [address] [place], [place] [address]  Troponin I (High Sensitivity)     Status: None   Collection Time: 07/08/20  8:35 PM  Result Value Ref Range   Troponin I (High Sensitivity) <2 <18 ng/L    Comment: (NOTE) Elevated high sensitivity troponin I (hsTnI) values and significant  changes across serial measurements may suggest ACS but many other  chronic and acute conditions are known to elevate hsTnI results.  Refer to the "Links" section for chest pain algorithms and additional  guidance. Performed at [facility], [address] [place], [place] [address]   Troponin I (High Sensitivity)     Status: None   Collection Time: 07/08/20 10:43 PM  Result Value Ref Range   Troponin I (High Sensitivity) <2 <18 ng/L    Comment: (NOTE) Elevated high sensitivity troponin I (hsTnI) values and significant  changes across serial measurements may suggest ACS but many other  chronic and acute conditions are known to elevate hsTnI results.  Refer to the "Links" section for chest pain algorithms and  additional  guidance. Performed at [facility], [address] [place], [place] [id number]     Chemistries  Recent Labs  Lab 07/08/20 2035  NA 132*  K 4.1  CL 100  CO2 23  GLUCOSE 134*  BUN 16  CREATININE 0.92  CALCIUM 9.7   ------------------------------------------------------------------------------------------------------------------  ------------------------------------------------------------------------------------------------------------------ GFR: Estimated Creatinine Clearance: 176.7 mL/min (by C-G formula based on SCr of 0.92 mg/dL). Liver Function Tests: No results for input(s): AST, ALT, ALKPHOS, BILITOT, PROT, ALBUMIN in the last 168 hours. No results for input(s): LIPASE, AMYLASE in the last 168 hours. No results for input(s): AMMONIA in the last 168 hours. Coagulation Profile: No results for input(s): INR, PROTIME in the last 168 hours. Cardiac Enzymes: No results for input(s): CKTOTAL, CKMB, CKMBINDEX, TROPONINI in the last 168 hours. BNP (last 3 results) No results for input(s): PROBNP in the last 8760 hours. HbA1C: No results for input(s): HGBA1C in the last 72 hours. CBG: No results for input(s): GLUCAP in the last 168 hours. Lipid Profile: No results for input(s): CHOL, HDL, LDLCALC, TRIG, CHOLHDL, LDLDIRECT in the last 72 hours. Thyroid Function Tests: No results for input(s): TSH, T4TOTAL, FREET4, T3FREE, THYROIDAB in the last 72 hours. Anemia Panel: No results for input(s): VITAMINB12, FOLATE, FERRITIN, TIBC, IRON, RETICCTPCT in the last 72 hours.  --------------------------------------------------------------------------------------------------------------- Urine analysis: No results found for: COLORURINE, APPEARANCEUR, LABSPEC, PHURINE, GLUCOSEU, HGBUR, BILIRUBINUR, KETONESUR, PROTEINUR, UROBILINOGEN, NITRITE, LEUKOCYTESUR    Imaging Results:    DG Chest 2 View  Result Date: 07/08/2020 CLINICAL DATA:  Cough and chest congestion.  Previous coronavirus infection. EXAM: CHEST - 2 VIEW COMPARISON:  06/05/2008 FINDINGS: Heart size upper limits of normal. Mediastinal shadows are normal. Mild atelectasis or scarring at the right lung base. Bronchial thickening pattern suggesting bronchitis. No consolidation or lobar collapse. No effusions. No significant finding. IMPRESSION: Bronchial thickening pattern suggesting bronchitis. Mild atelectasis or scarring at the right lung base. Electronically Signed   By: Paulina FusiMark  Shogry M.D.   On: 07/08/2020 14:19   CT Angio Chest PE W and/or Wo Contrast  Result Date: 07/08/2020 CLINICAL DATA:  Tachycardia, cough, COVID-19 diagnosed 05/31/2020 EXAM: CT ANGIOGRAPHY CHEST WITH CONTRAST TECHNIQUE: Multidetector CT imaging of the chest was performed using the standard protocol during bolus administration of intravenous contrast. Multiplanar CT image reconstructions and MIPs were obtained to evaluate the vascular anatomy. CONTRAST:  100mL OMNIPAQUE IOHEXOL 350 MG/ML SOLN COMPARISON:  07/08/2020 FINDINGS: Cardiovascular: This is a technically suboptimal evaluation of the pulmonary vasculature due to poor intravenous access and limitations with contrast bolus. Pulmonary emboli cannot be excluded. The heart is unremarkable without pericardial effusion. Normal caliber of the thoracic aorta. Mediastinum/Nodes: No enlarged mediastinal, hilar, or axillary lymph nodes. Thyroid gland, trachea, and esophagus demonstrate no significant findings. Lungs/Pleura: No airspace disease, effusion, or pneumothorax. Mild bronchial wall thickening. Central airways are patent. Upper Abdomen: No acute abnormality. Musculoskeletal: No acute or destructive bony lesions. Reconstructed images demonstrate no additional findings. Review of the MIP images confirms the above findings. IMPRESSION: 1. Nondiagnostic evaluation of the pulmonary arteries due to limitations of contrast bolus. Pulmonary emboli cannot be excluded. 2. Mild bilateral  bronchial wall thickening consistent with reactive airway disease. No acute airspace disease. Electronically Signed   By: Sharlet SalinaMichael  Brown M.D.   On: 07/08/2020 22:51    My  personal review of EKG: Rhythm sinus tach, Rate 112 /min, QTc 434 ,no Acute ST changes   Assessment & Plan:    Active Problems:   Respiratory failure with hypoxia (HCC)   1. Acute hypoxic respiratory failure 1. Dropping down to mid upper 80s with ambulation 2. Normalized with 2 L nasal cannula 3. CT angio was nondiagnostic for pulmonary embolism due to timing of the contrast bolus.  It did show mild bilateral bronchial wall thickening indicative of reactive airway 4. Chest x-ray shows bronchitis 5. Troponins are undetectable x2 6. EKG is nonischemic 7. Wean off O2 as tolerated 8. Continue to monitor 2. Asthma exacerbation 1. History of asthma, asymptomatic for many years 2. No inhalers to try at home 3. Albuterol, mag, Solu-Medrol, prednisone given in the ER and resolved wheezing 4. Continue albuterol, prednisone, Zithromax 5. Culture sputum 6. Continue to monitor 3. COVID-19 1. Patient reports that he has been positive for COVID-19 for at least a couple of weeks already 2. Reports his initial COVID-19 symptoms have resolved 3. Continue to monitor 4. Elevated blood pressure without the diagnosis of hypertension 1. Likely due to acute illness 2. Continue to monitor   DVT Prophylaxis-   Heparin- SCDs   AM Labs Ordered, also please review Full Orders  Family Communication: No family at bedside  Code Status: Full  Admission status: Observation  Time spent in minutes : 65   Ameirah Khatoon B Zierle-Ghosh DO

## 2020-07-09 NOTE — Progress Notes (Signed)
MD notified of IV access removal

## 2020-07-09 NOTE — Plan of Care (Signed)

## 2020-07-10 LAB — BASIC METABOLIC PANEL
Anion gap: 12 (ref 5–15)
BUN: 18 mg/dL (ref 6–20)
CO2: 24 mmol/L (ref 22–32)
Calcium: 9.6 mg/dL (ref 8.9–10.3)
Chloride: 100 mmol/L (ref 98–111)
Creatinine, Ser: 0.86 mg/dL (ref 0.61–1.24)
GFR, Estimated: >60 mL/min (ref >60)
Glucose, Bld: 162 mg/dL — ABNORMAL HIGH (ref 70–99)
Potassium: 4.3 mmol/L (ref 3.5–5.1)
Sodium: 136 mmol/L (ref 135–145)

## 2020-07-10 LAB — LIPID PANEL
Cholesterol: 210 mg/dL — ABNORMAL HIGH (ref 0–200)
HDL: 65 mg/dL (ref >40)
LDL Cholesterol: 127 mg/dL — ABNORMAL HIGH (ref 0–99)
Total CHOL/HDL Ratio: 3.2 RATIO
Triglycerides: 89 mg/dL (ref <150)
VLDL: 18 mg/dL (ref 0–40)

## 2020-07-10 LAB — GLUCOSE, CAPILLARY
Glucose-Capillary: 143 mg/dL — ABNORMAL HIGH (ref 70–99)
Glucose-Capillary: 183 mg/dL — ABNORMAL HIGH (ref 70–99)

## 2020-07-10 LAB — VITAMIN D 25 HYDROXY (VIT D DEFICIENCY, FRACTURES): Vit D, 25-Hydroxy: 12.9 ng/mL — ABNORMAL LOW (ref 30–100)

## 2020-07-10 MED ORDER — ACETAMINOPHEN 325 MG PO TABS: 650.0000 mg | ORAL_TABLET | Freq: Four times a day (QID) | ORAL | Status: AC | PRN

## 2020-07-10 MED ORDER — AMLODIPINE BESYLATE 5 MG PO TABS: 5.0000 mg | ORAL_TABLET | Freq: Every day | ORAL | 2 refills | Status: AC

## 2020-07-10 MED ORDER — PRAVASTATIN SODIUM 20 MG PO TABS: 20.0000 mg | ORAL_TABLET | Freq: Every evening | ORAL | 2 refills | Status: AC

## 2020-07-10 MED ORDER — BLOOD GLUCOSE METER KIT: PACK | 0 refills | Status: AC

## 2020-07-10 MED ORDER — LIVING WELL WITH DIABETES BOOK: Freq: Once | Status: AC

## 2020-07-10 MED ORDER — PREDNISONE 20 MG PO TABS: ORAL_TABLET | ORAL | 0 refills | Status: AC

## 2020-07-10 MED ORDER — METFORMIN HCL ER 500 MG PO TB24: ORAL_TABLET | ORAL | 2 refills | Status: AC

## 2020-07-10 MED ORDER — ALBUTEROL SULFATE HFA 108 (90 BASE) MCG/ACT IN AERS: 2.0000 | INHALATION_SPRAY | RESPIRATORY_TRACT | 2 refills | Status: AC | PRN

## 2020-07-10 NOTE — Discharge Summary (Signed)
Physician Discharge Summary  Ryan Marquez [id number] DOB: 05/09/1996 DOA: 07/08/2020  PCP: [facility] to [facility]   Admit date: 07/08/2020 Discharge date: 07/10/2020  Admitted From:  Home  Disposition: Home   Recommendations for Outpatient Follow-up:  1. Please establish care with PCP in 2-4 weeks 2. Please monitor blood sugars at least once per day  Discharge Condition: STABLE    CODE STATUS: FULL   Brief Hospitalization Summary: Please see all hospital notes, images, labs for full details of the hospitalization. ADMISSION HPI:  24 y.o. male, with history of asthma and obesity presents to the ED with a chief complaint of chest pain.  Was sudden in onset yesterday.  Reports it was initially a 10 out of 10, now it is comfortable.  The pain is described as a squeezing pressure-like pain.  Coughing makes it worse.  Pain is nonexertional.  It is better with sleep.  Patient reports when he coughs he is productive of clear sputum.  Cough started yesterday.  He had a cough when he was initially diagnosed with COVID-19, but this cough is different than that one per his report.  Patient has not had any fevers or palpitations.  On review of systems he does admit to "scratchy" ear pain 2 days ago.  He notes that he was wheezing when he came into the ED.  Is a history of pediatric asthma, but reports that his mother never got his inhalers refilled, so he does not have any inhalers to try at home.  Patient reports no new exposures at home.  Patient reports to feeling much better after treatment in the ER.  In the ED Temperature 98.8, heart rate 94 -112, respiratory rate 15-32, blood pressure 152/95 Leukocytosis 10.1.  CHEM panel is unremarkable Troponins are undetectable x2.  CT angio was nondiagnostic eval for pulmonary and, but did show bronchial wall thickening indicative of reactive airway EKG shows a heart rate of 112, QTc 434, sinus tach Chest x-ray shows bronchitis Albuterol, mag, Solu-Medrol,  prednisone given in the ED -improvement of symptoms with this treatment.  Covid positive Patient was ambulated in the ED with oxygen saturations dropping into the mid high 80s.  Admission requested for respiratory failure  Hospital Course  The patient was admitted with an asthma exacerbation complicated by Covid infection.   He had an equivocal CTA chest but negative venous dopplers in legs.  He was noted to have newly diagnosed type 2 diabetes mellitus and hypertension. A1c was 7.0%.  He also has mixed hyperlipidemia.  He was started on IV steroids and bronchodilators with good results.  He was counseled and started on metformin XR for diabetes management and amlodipine 5 mg daily for hypertension.  He responded well.  Bp and BS are improved.  He was seen by diabetes coordinator and Education officer, museum for assistance with PCP and medications.  He will discharge on pravastatin 20 mg daily for lipids.  He was advised to establish care with PCP in 2 - 4 weeks.  He was referred to outpatient diabetes education center.   Due to the cost of medications we have asked for the patient to consider going to [facility] to gain low cost blood glucose meter and testing strips and also obtain medications through the $4 [facility] prescription list which he has agreed to do so.  Encouraged exercise and weight loss.   Discharge Diagnoses:  Principal Problem:   Respiratory failure with hypoxia ([place]) Active Problems:   Type 2 diabetes mellitus ([place])  Asthma with acute exacerbation   COVID-19 virus infection   Elevated d-dimer   Hypoxia   Asthma exacerbation   Morbid obesity   Discharge Instructions: Discharge Instructions    Referral to Nutrition and Diabetes Services   Complete by: As directed    Choose type of Diabetes Self-Management Training (DSMT) training services and number of hours requested: Initial DSMT: 10 hours   Check all special needs that apply to patient requiring 1 on 1 DSMT: Low literacy   DSMT Content:  Comprehensive self-management skills- All of the content areas   Choose the type of Medical Nutrition Therapy (MNT) and number of hours: Initial MNT: 3 hours   FOR MEDICARE PATIENTS: I hereby certify that I am managing this beneficiary's diabetes condition and that the above prescribed training is a necessary part of management.: Does not apply     Allergies as of 07/10/2020   No Known Allergies     Medication List    TAKE these medications   albuterol 108 (90 Base) MCG/ACT inhaler Commonly known as: VENTOLIN HFA Inhale 2 puffs into the lungs every 4 (four) hours as needed for wheezing or shortness of breath.   amLODipine 5 MG tablet Commonly known as: NORVASC Take 1 tablet (5 mg total) by mouth daily. Start taking on: July 11, 2020   blood glucose meter kit and supplies Dispense based on patient and insurance preference. Use up to four times daily as directed. (FOR ICD-10 E10.9, E11.9).   ibuprofen 200 MG tablet Commonly known as: ADVIL Take 200 mg by mouth every 6 (six) hours as needed for headache or mild pain.   metFORMIN 500 MG 24 hr tablet Commonly known as: GLUCOPHAGE-XR Take 1 daily with supper x 3 days, then 1 po BID with meals x 1 week, then 2 po BID with meals   pravastatin 20 MG tablet Commonly known as: Pravachol Take 1 tablet (20 mg total) by mouth every evening.   predniSONE 20 MG tablet Commonly known as: DELTASONE Take 2 PO QAM x5days then 1 po QAM x 5 days then stop       No Known Allergies Allergies as of 07/10/2020   No Known Allergies     Medication List    TAKE these medications   albuterol 108 (90 Base) MCG/ACT inhaler Commonly known as: VENTOLIN HFA Inhale 2 puffs into the lungs every 4 (four) hours as needed for wheezing or shortness of breath.   amLODipine 5 MG tablet Commonly known as: NORVASC Take 1 tablet (5 mg total) by mouth daily. Start taking on: July 11, 2020   blood glucose meter kit and supplies Dispense based on  patient and insurance preference. Use up to four times daily as directed. (FOR ICD-10 E10.9, E11.9).   ibuprofen 200 MG tablet Commonly known as: ADVIL Take 200 mg by mouth every 6 (six) hours as needed for headache or mild pain.   metFORMIN 500 MG 24 hr tablet Commonly known as: GLUCOPHAGE-XR Take 1 daily with supper x 3 days, then 1 po BID with meals x 1 week, then 2 po BID with meals   pravastatin 20 MG tablet Commonly known as: Pravachol Take 1 tablet (20 mg total) by mouth every evening.   predniSONE 20 MG tablet Commonly known as: DELTASONE Take 2 PO QAM x5days then 1 po QAM x 5 days then stop      Procedures/Studies: DG Chest 2 View  Result Date: 07/08/2020 CLINICAL DATA:  Cough and chest congestion. Previous coronavirus  infection. EXAM: CHEST - 2 VIEW COMPARISON:  06/05/2008 FINDINGS: Heart size upper limits of normal. Mediastinal shadows are normal. Mild atelectasis or scarring at the right lung base. Bronchial thickening pattern suggesting bronchitis. No consolidation or lobar collapse. No effusions. No significant finding. IMPRESSION: Bronchial thickening pattern suggesting bronchitis. Mild atelectasis or scarring at the right lung base. Electronically Signed   By: Nelson Chimes M.D.   On: 07/08/2020 14:19   CT Angio Chest PE W and/or Wo Contrast  Result Date: 07/08/2020 CLINICAL DATA:  Tachycardia, cough, COVID-19 diagnosed 05/31/2020 EXAM: CT ANGIOGRAPHY CHEST WITH CONTRAST TECHNIQUE: Multidetector CT imaging of the chest was performed using the standard protocol during bolus administration of intravenous contrast. Multiplanar CT image reconstructions and MIPs were obtained to evaluate the vascular anatomy. CONTRAST:  148m OMNIPAQUE IOHEXOL 350 MG/ML SOLN COMPARISON:  07/08/2020 FINDINGS: Cardiovascular: This is a technically suboptimal evaluation of the pulmonary vasculature due to poor intravenous access and limitations with contrast bolus. Pulmonary emboli cannot be  excluded. The heart is unremarkable without pericardial effusion. Normal caliber of the thoracic aorta. Mediastinum/Nodes: No enlarged mediastinal, hilar, or axillary lymph nodes. Thyroid gland, trachea, and esophagus demonstrate no significant findings. Lungs/Pleura: No airspace disease, effusion, or pneumothorax. Mild bronchial wall thickening. Central airways are patent. Upper Abdomen: No acute abnormality. Musculoskeletal: No acute or destructive bony lesions. Reconstructed images demonstrate no additional findings. Review of the MIP images confirms the above findings. IMPRESSION: 1. Nondiagnostic evaluation of the pulmonary arteries due to limitations of contrast bolus. Pulmonary emboli cannot be excluded. 2. Mild bilateral bronchial wall thickening consistent with reactive airway disease. No acute airspace disease. Electronically Signed   By: MRanda NgoM.D.   On: 07/08/2020 22:51   [place]Venous Img Lower Bilateral (DVT)  Result Date: 07/09/2020 CLINICAL DATA:  Bilateral lower extremity pain. EXAM: BILATERAL LOWER EXTREMITY VENOUS DOPPLER ULTRASOUND TECHNIQUE: Gray-scale sonography with graded compression, as well as color Doppler and duplex ultrasound were performed to evaluate the lower extremity deep venous systems from the level of the common femoral vein and including the common femoral, femoral, profunda femoral, popliteal and calf veins including the posterior tibial, peroneal and gastrocnemius veins when visible. The superficial great saphenous vein was also interrogated. Spectral Doppler was utilized to evaluate flow at rest and with distal augmentation maneuvers in the common femoral, femoral and popliteal veins. COMPARISON:  None. FINDINGS: RIGHT LOWER EXTREMITY Common Femoral Vein: No evidence of thrombus. Normal compressibility, respiratory phasicity and response to augmentation. Saphenofemoral Junction: No evidence of thrombus. Normal compressibility and flow on color Doppler imaging.  Profunda Femoral Vein: No evidence of thrombus. Normal compressibility and flow on color Doppler imaging. Femoral Vein: No evidence of thrombus. Normal compressibility, respiratory phasicity and response to augmentation. Popliteal Vein: No evidence of thrombus. Normal compressibility, respiratory phasicity and response to augmentation. Calf Veins: No evidence of thrombus. Normal compressibility and flow on color Doppler imaging. Superficial Great Saphenous Vein: No evidence of thrombus. Normal compressibility. Venous Reflux:  None. Other Findings:  None. LEFT LOWER EXTREMITY Common Femoral Vein: No evidence of thrombus. Normal compressibility, respiratory phasicity and response to augmentation. Saphenofemoral Junction: No evidence of thrombus. Normal compressibility and flow on color Doppler imaging. Profunda Femoral Vein: No evidence of thrombus. Normal compressibility and flow on color Doppler imaging. Femoral Vein: No evidence of thrombus. Normal compressibility, respiratory phasicity and response to augmentation. Popliteal Vein: No evidence of thrombus. Normal compressibility, respiratory phasicity and response to augmentation. Calf Veins: No evidence of thrombus. Normal compressibility and flow  on color Doppler imaging. Superficial Great Saphenous Vein: No evidence of thrombus. Normal compressibility. Venous Reflux:  None. Other Findings:  None. IMPRESSION: No evidence of deep venous thrombosis in either lower extremity. Electronically Signed   By: Marijo Conception M.D.   On: 07/09/2020 13:12   DG Chest Port 1 View  Result Date: 07/09/2020 CLINICAL DATA:  Chest congestion EXAM: PORTABLE CHEST 1 VIEW COMPARISON:  07/08/2020 FINDINGS: The heart size and mediastinal contours are within normal limits. Mild perihilar interstitial prominence bilaterally. No focal airspace consolidation, pleural effusion, or pneumothorax. The visualized skeletal structures are unremarkable. IMPRESSION: Mild bronchitic type lung  changes without focal airspace consolidation. Electronically Signed   By: Davina Poke D.O.   On: 07/09/2020 08:02   [place] EKG SITE RITE  Result Date: 07/09/2020 If Site Rite image not attached, placement could not be confirmed due to current cardiac rhythm.     Subjective: Pt reports he is breathing much better today. He is ambulating in room with no difficulty.  No complaints.   Discharge Exam: Vitals:   07/09/20 2157 07/10/20 0602  BP: (!) 119/99 134/87  Pulse: 88 76  Resp: 20 19  Temp: 98.4 F (36.9 C) 97.9 F (36.6 C)  SpO2: 95% 99%   Vitals:   07/09/20 1422 07/09/20 2138 07/09/20 2157 07/10/20 0602  BP: 139/72  (!) 119/99 134/87  Pulse: 93  88 76  Resp: 18  20 19   Temp: 98.2 F (36.8 C)  98.4 F (36.9 C) 97.9 F (36.6 C)  TempSrc:      SpO2: 96% 96% 95% 99%  Weight:      Height:       General: Pt is morbidly obese, alert, awake, not in acute distress Cardiovascular: normal S1/S2 +, no rubs, no gallops Respiratory: CTA bilaterally, no wheezing, no rhonchi Abdominal: Soft, NT, ND, bowel sounds + Extremities: no edema, no cyanosis   The results of significant diagnostics from this hospitalization (including imaging, microbiology, ancillary and laboratory) are listed below for reference.     Microbiology: Recent Results (from the past 240 hour(s))  Respiratory Panel by RT PCR (Flu A&B, Covid) - Nasopharyngeal Swab     Status: Abnormal   Collection Time: 07/08/20  1:35 PM   Specimen: Nasopharyngeal Swab  Result Value Ref Range Status   SARS Coronavirus 2 by RT PCR POSITIVE (A) NEGATIVE Final    Comment: RESULT CALLED TO, READ BACK BY AND VERIFIED WITH: GANT,E. AT 1505 BY HUFFINES,S ON 07/08/20. (NOTE) SARS-CoV-2 target nucleic acids are DETECTED.  SARS-CoV-2 RNA is generally detectable in upper respiratory specimens  during the acute phase of infection. Positive results are indicative of the presence of the identified virus, but do not rule out bacterial  infection or co-infection with other pathogens not detected by the test. Clinical correlation with patient history and other diagnostic information is necessary to determine patient infection status. The expected result is Negative.  Fact Sheet for Patients:  PinkCheek.be  Fact Sheet for Healthcare Providers: GravelBags.it  This test is not yet approved or cleared by the [place] FDA and  has been authorized for detection and/or diagnosis of SARS-CoV-2 by FDA under an Emergency Use Authorization (EUA).  This EUA will remain in effect (meaning this test  can be used) for the duration of  the COVID-19 declaration under Section 564(b)(1) of the Act, 21 U.S.C. section 360bbb-3(b)(1), unless the authorization is terminated or revoked sooner.      Influenza A by PCR NEGATIVE NEGATIVE  Final   Influenza B by PCR NEGATIVE NEGATIVE Final    Comment: (NOTE) The Xpert Xpress SARS-CoV-2/FLU/RSV assay is intended as an aid in  the diagnosis of influenza from Nasopharyngeal swab specimens and  should not be used as a sole basis for treatment. Nasal washings and  aspirates are unacceptable for Xpert Xpress SARS-CoV-2/FLU/RSV  testing.  Fact Sheet for Patients: PinkCheek.be  Fact Sheet for Healthcare Providers: GravelBags.it  This test is not yet approved or cleared by the [place] FDA and  has been authorized for detection and/or diagnosis of SARS-CoV-2 by  FDA under an Emergency Use Authorization (EUA). This EUA will remain  in effect (meaning this test can be used) for the duration of the  Covid-19 declaration under Section 564(b)(1) of the Act, 21  U.S.C. section 360bbb-3(b)(1), unless the authorization is  terminated or revoked. Performed at [facility], [address] [place], [place] [address]      Labs: BNP (last 3 results) No results for input(s): BNP  in the last 8760 hours. Basic Metabolic Panel: Recent Labs  Lab 07/08/20 2035 07/09/20 0516 07/10/20 0651  NA 132* 134* 136  K 4.1 4.7 4.3  CL 100 100 100  CO2 23 22 24   GLUCOSE 134* 233* 162*  BUN 16 19 18   CREATININE 0.92 0.99 0.86  CALCIUM 9.7 9.6 9.6  MG  --  2.0  --    Liver Function Tests: Recent Labs  Lab 07/09/20 0516  AST 23  ALT 32  ALKPHOS 83  BILITOT 0.5  PROT 8.6*  ALBUMIN 4.3   No results for input(s): LIPASE, AMYLASE in the last 168 hours. No results for input(s): AMMONIA in the last 168 hours. CBC: Recent Labs  Lab 07/08/20 2035 07/09/20 0516  WBC 10.1 10.7*  NEUTROABS 8.6* 9.2*  HGB 13.2 12.8*  HCT 41.8 40.5  MCV 81.8 82.8  PLT 352 368   Cardiac Enzymes: No results for input(s): CKTOTAL, CKMB, CKMBINDEX, TROPONINI in the last 168 hours. BNP: Invalid input(s): POCBNP CBG: Recent Labs  Lab 07/09/20 1124 07/09/20 1618 07/09/20 2218 07/10/20 0747 07/10/20 1143  GLUCAP 154* 157* 139* 143* 183*   D-Dimer Recent Labs    07/09/20 0811  DDIMER 0.37   Hgb A1c Recent Labs    07/09/20 0811  HGBA1C 7.0*   Lipid Profile Recent Labs    07/10/20 0651  CHOL 210*  HDL 65  LDLCALC 127*  TRIG 89  CHOLHDL 3.2   Thyroid function studies No results for input(s): TSH, T4TOTAL, T3FREE, THYROIDAB in the last 72 hours.  Invalid input(s): FREET3 Anemia work up No results for input(s): VITAMINB12, FOLATE, FERRITIN, TIBC, IRON, RETICCTPCT in the last 72 hours. Urinalysis No results found for: COLORURINE, APPEARANCEUR, [place], [place], GLUCOSEU, [place], [place], [place], PROTEINUR, UROBILINOGEN, NITRITE, LEUKOCYTESUR Sepsis Labs Invalid input(s): PROCALCITONIN,  WBC,  LACTICIDVEN Microbiology Recent Results (from the past 240 hour(s))  Respiratory Panel by RT PCR (Flu A&B, Covid) - Nasopharyngeal Swab     Status: Abnormal   Collection Time: 07/08/20  1:35 PM   Specimen: Nasopharyngeal Swab  Result Value Ref Range Status   SARS  Coronavirus 2 by RT PCR POSITIVE (A) NEGATIVE Final    Comment: RESULT CALLED TO, READ BACK BY AND VERIFIED WITH: GANT,E. AT 1505 BY HUFFINES,S ON 07/08/20. (NOTE) SARS-CoV-2 target nucleic acids are DETECTED.  SARS-CoV-2 RNA is generally detectable in upper respiratory specimens  during the acute phase of infection. Positive results are indicative of the presence of the identified  virus, but do not rule out bacterial infection or co-infection with other pathogens not detected by the test. Clinical correlation with patient history and other diagnostic information is necessary to determine patient infection status. The expected result is Negative.  Fact Sheet for Patients:  PinkCheek.be  Fact Sheet for Healthcare Providers: GravelBags.it  This test is not yet approved or cleared by the [place] FDA and  has been authorized for detection and/or diagnosis of SARS-CoV-2 by FDA under an Emergency Use Authorization (EUA).  This EUA will remain in effect (meaning this test  can be used) for the duration of  the COVID-19 declaration under Section 564(b)(1) of the Act, 21 U.S.C. section 360bbb-3(b)(1), unless the authorization is terminated or revoked sooner.      Influenza A by PCR NEGATIVE NEGATIVE Final   Influenza B by PCR NEGATIVE NEGATIVE Final    Comment: (NOTE) The Xpert Xpress SARS-CoV-2/FLU/RSV assay is intended as an aid in  the diagnosis of influenza from Nasopharyngeal swab specimens and  should not be used as a sole basis for treatment. Nasal washings and  aspirates are unacceptable for Xpert Xpress SARS-CoV-2/FLU/RSV  testing.  Fact Sheet for Patients: PinkCheek.be  Fact Sheet for Healthcare Providers: GravelBags.it  This test is not yet approved or cleared by the [place] FDA and  has been authorized for detection and/or diagnosis of  SARS-CoV-2 by  FDA under an Emergency Use Authorization (EUA). This EUA will remain  in effect (meaning this test can be used) for the duration of the  Covid-19 declaration under Section 564(b)(1) of the Act, 21  U.S.C. section 360bbb-3(b)(1), unless the authorization is  terminated or revoked. Performed at [facility], [address] [place], [place] [address]    Time coordinating discharge: 38 mins   SIGNED:  Irwin Brakeman, MD  [facility] 07/10/2020, 11:54 AM How to contact the [facility] Attending or Consulting provider [place] or covering provider during after hours [place], for this patient?  1. Check the care team in [facility] and look for a) attending/consulting [facility] provider listed and b) the [facility] team listed 2. Log into [email or web address] and use [facility]'s universal password to access. If you do not have the password, please contact the hospital operator. 3. Locate the [facility] provider you are looking for under [facility] and page to a number that you can be directly reached. 4. If you still have difficulty reaching the provider, please page the [facility] (Director on Call) for the Hospitalists listed on amion for assistance.

## 2020-07-10 NOTE — TOC Initial Note (Signed)
Transition of Care [facility]) - Initial/Assessment Note    Patient Details  Name: Ryan Marquez MRN: [id number] Date of Birth: [birth year]  [facility] CM/SW Contact:    Annice Needy, LCSW Phone Number: 07/10/2020, 10:42 AM  Clinical Narrative:                 Patient from mother with mother. Unemployed, uninsured. COVID+, new diabetes diagnosis. Agreeable to referral to [facility]. Norval Gable, with [facility] took referral.   Expected Discharge Plan: Home/Self Care Barriers to Discharge: No Barriers Identified   Patient Goals and CMS Choice Patient states their goals for this hospitalization and ongoing recovery are:: return home      Expected Discharge Plan and Services Expected Discharge Plan: Home/Self Care       Living arrangements for the past 2 months: Apartment                                      Prior Living Arrangements/Services Living arrangements for the past 2 months: Apartment Lives with:: Parents                   Activities of Daily Living Home Assistive Devices/Equipment: None ADL Screening (condition at time of admission) Patient's cognitive ability adequate to safely complete daily activities?: Yes Is the patient deaf or have difficulty hearing?: No Does the patient have difficulty seeing, even when wearing glasses/contacts?: No Does the patient have difficulty concentrating, remembering, or making decisions?: No Patient able to express need for assistance with ADLs?: Yes Does the patient have difficulty dressing or bathing?: No Independently performs ADLs?: Yes (appropriate for developmental age) Does the patient have difficulty walking or climbing stairs?: No Weakness of Legs: None Weakness of Arms/Hands: None  Permission Sought/Granted                  Emotional Assessment     Affect (typically observed): Appropriate Orientation: : Oriented to Self, Oriented to Place, Oriented to  Time, Oriented  to Situation Alcohol / Substance Use: Not Applicable Psych Involvement: No (comment)  Admission diagnosis:  Hypoxia [R09.02] Respiratory failure with hypoxia (HCC) [J96.91] Asthma exacerbation [J45.901] Patient Active Problem List   Diagnosis Date Noted  . Type 2 diabetes mellitus (HCC) 07/09/2020  . Asthma with acute exacerbation 07/09/2020  . COVID-19 virus infection 07/09/2020  . Asthma exacerbation 07/09/2020  . Elevated d-dimer   . Hypoxia   . Respiratory failure with hypoxia (HCC) 07/08/2020   PCP:  Patient, No Pcp Per Pharmacy:   [facility] [phone] - [place], [place] - [address] AT [facility] [address] [place] [address] Phone: [phone] Fax: [phone]     Social Determinants of Health (SDOH) Interventions    Readmission Risk Interventions No flowsheet data found.

## 2020-07-10 NOTE — Discharge Instructions (Signed)
Alcohol Swabs Alcohol swabs are used to sterilize your injection site. All of our swabs are individually wrapped for maximum safety, convenience and moisture retention. [employer] Alcohol Swabs . 100 ct Swabs..............................$1.00 . 400 ct Swabs..............................$3.74  Lancets [employer] offers three lancet options conveniently designed to work with almost every lancing device. Each features a protective disk, which guarantees sterility before testing. [employer] Lancets . 100 ct Lancets $1.56 . 200 ct Lancets $2.64 Available in Ultra-Thin, Thin & Micro-Thin [facility] 2-IN-1 Lancing Device . 50 ct Lancets..................................... $3.44 Available in 30 gauge and 25 gauge [facility] Lancing Device....................$5.84  Blood Glucose Monitors [facility] offers a full range of blood glucose testing options to provide an accurate, affordable system that meets each person's unique needs and preferences. Prime Meter....................................... $9.00 Prime Test Strips . 25 test strips.................................... $5.00 . 50 test strips.................................... $9.00 . 100 test strips.................................$17.88 Premier BLU Meter  ............  $18.98 Premier Voice Meter  .............  $14.98 Premier Test Strips . 50 test strips.................................... $9.00 . 100 test strips.................................$17.88 Premier [employer]  ............  $19.44 Kit includes: . 50 test strips . 10 lancets . Lancing device . Carry case     Diabetes Basics  Diabetes (diabetes mellitus) is a long-term (chronic) disease. It occurs when the body does not properly use sugar (glucose) that is released from food after you eat. Diabetes may be caused by one or both of these problems:  Your pancreas does not make enough of a hormone called insulin.  Your body does not react in a normal way to insulin that it  makes. Insulin lets sugars (glucose) go into cells in your body. This gives you energy. If you have diabetes, sugars cannot get into cells. This causes high blood sugar (hyperglycemia). Follow these instructions at home: How is diabetes treated? You may need to take insulin or other diabetes medicines daily to keep your blood sugar in balance. Take your diabetes medicines every day as told by your doctor. List your diabetes medicines here: Diabetes medicines  Name of medicine: ______________________________ ? Amount (dose): _______________ Time (a.m./p.m.): _______________ Notes: ___________________________________  Name of medicine: ______________________________ ? Amount (dose): _______________ Time (a.m./p.m.): _______________ Notes: ___________________________________  Name of medicine: ______________________________ ? Amount (dose): _______________ Time (a.m./p.m.): _______________ Notes: ___________________________________ If you use insulin, you will learn how to give yourself insulin by injection. You may need to adjust the amount based on the food that you eat. List the types of insulin you use here: Insulin  Insulin type: ______________________________ ? Amount (dose): _______________ Time (a.m./p.m.): _______________ Notes: ___________________________________  Insulin type: ______________________________ ? Amount (dose): _______________ Time (a.m./p.m.): _______________ Notes: ___________________________________  Insulin type: ______________________________ ? Amount (dose): _______________ Time (a.m./p.m.): _______________ Notes: ___________________________________  Insulin type: ______________________________ ? Amount (dose): _______________ Time (a.m./p.m.): _______________ Notes: ___________________________________  Insulin type: ______________________________ ? Amount (dose): _______________ Time (a.m./p.m.): _______________ Notes:  ___________________________________ How do I manage my blood sugar?  Check your blood sugar levels using a blood glucose monitor as directed by your doctor. Your doctor will set treatment goals for you. Generally, you should have these blood sugar levels:  Before meals (preprandial): 80-130 mg/dL (4.4-7.2 mmol/L).  After meals (postprandial): below 180 mg/dL (10 mmol/L).  A1c level: less than 7%. Write down the times that you will check your blood sugar levels: Blood sugar checks  Time: _______________ Notes: ___________________________________  Time: _______________ Notes: ___________________________________  Time: _______________ Notes: ___________________________________  Time: _______________ Notes: ___________________________________  Time: _______________ Notes: ___________________________________  Time: _______________ Notes: ___________________________________  What do I need  to know about low blood sugar? Low blood sugar is called hypoglycemia. This is when blood sugar is at or below 70 mg/dL (3.9 mmol/L). Symptoms may include:  Feeling: ? Hungry. ? Worried or nervous (anxious). ? Sweaty and clammy. ? Confused. ? Dizzy. ? Sleepy. ? Sick to your stomach (nauseous).  Having: ? A fast heartbeat. ? A headache. ? A change in your vision. ? Tingling or no feeling (numbness) around the mouth, lips, or tongue. ? Jerky movements that you cannot control (seizure).  Having trouble with: ? Moving (coordination). ? Sleeping. ? Passing out (fainting). ? Getting upset easily (irritability). Treating low blood sugar To treat low blood sugar, eat or drink something sugary right away. If you can think clearly and swallow safely, follow the 15:15 rule:  Take 15 grams of a fast-acting carb (carbohydrate). Talk with your doctor about how much you should take.  Some fast-acting carbs are: ? Sugar tablets (glucose pills). Take 3-4 glucose pills. ? 6-8 pieces of hard  candy. ? 4-6 oz (120-150 mL) of fruit juice. ? 4-6 oz (120-150 mL) of regular (not diet) soda. ? 1 Tbsp (15 mL) honey or sugar.  Check your blood sugar 15 minutes after you take the carb.  If your blood sugar is still at or below 70 mg/dL (3.9 mmol/L), take 15 grams of a carb again.  If your blood sugar does not go above 70 mg/dL (3.9 mmol/L) after 3 tries, get help right away.  After your blood sugar goes back to normal, eat a meal or a snack within 1 hour. Treating very low blood sugar If your blood sugar is at or below 54 mg/dL (3 mmol/L), you have very low blood sugar (severe hypoglycemia). This is an emergency. Do not wait to see if the symptoms will go away. Get medical help right away. Call your local emergency services (911 in the [place] Do not drive yourself to the hospital. Questions to ask your health care provider  Do I need to meet with a diabetes educator?  What equipment will I need to care for myself at home?  What diabetes medicines do I need? When should I take them?  How often do I need to check my blood sugar?  What number can I call if I have questions?  When is my next doctor's visit?  Where can I find a support group for people with diabetes? Where to find more information  American Diabetes Association: www.diabetes.org  American Association of Diabetes Educators: www.diabeteseducator.org/patient-resources Contact a doctor if:  Your blood sugar is at or above 240 mg/dL (13.3 mmol/L) for 2 days in a row.  You have been sick or have had a fever for 2 days or more, and you are not getting better.  You have any of these problems for more than 6 hours: ? You cannot eat or drink. ? You feel sick to your stomach (nauseous). ? You throw up (vomit). ? You have watery poop (diarrhea). Get help right away if:  Your blood sugar is lower than 54 mg/dL (3 mmol/L).  You get confused.  You have trouble: ? Thinking clearly. ? Breathing. Summary  Diabetes  (diabetes mellitus) is a long-term (chronic) disease. It occurs when the body does not properly use sugar (glucose) that is released from food after digestion.  Take insulin and diabetes medicines as told.  Check your blood sugar every day, as often as told.  Keep all follow-up visits as told by your doctor. This is important.  This information is not intended to replace advice given to you by your health care provider. Make sure you discuss any questions you have with your health care provider. Document Revised: 05/08/2019 Document Reviewed: 11/17/2017 Elsevier Patient Education  [place].   IMPORTANT INFORMATION: PAY CLOSE ATTENTION   PHYSICIAN DISCHARGE INSTRUCTIONS  Follow with Primary care provider  Patient, No Pcp Per  and other consultants as instructed by your Hospitalist Physician  [place] IF SYMPTOMS COME BACK, WORSEN OR NEW PROBLEM DEVELOPS   Please note: You were cared for by a hospitalist during your hospital stay. Every effort will be made to forward records to your primary care provider.  You can request that your primary care provider send for your hospital records if they have not received them.  Once you are discharged, your primary care physician will handle any further medical issues. Please note that NO REFILLS for any discharge medications will be authorized once you are discharged, as it is imperative that you return to your primary care physician (or establish a relationship with a primary care physician if you do not have one) for your post hospital discharge needs so that they can reassess your need for medications and monitor your lab values.  Please get a complete blood count and chemistry panel checked by your Primary MD at your next visit, and again as instructed by your Primary MD.  Get Medicines reviewed and adjusted: Please take all your medications with you for your next visit with your Primary  MD  Laboratory/radiological data: Please request your Primary MD to go over all hospital tests and procedure/radiological results at the follow up, please ask your primary care provider to get all Hospital records sent to his/her office.  In some cases, they will be blood work, cultures and biopsy results pending at the time of your discharge. Please request that your primary care provider follow up on these results.  If you are diabetic, please bring your blood sugar readings with you to your follow up appointment with primary care.    Please call and make your follow up appointments as soon as possible.    Also Note the following: If you experience worsening of your admission symptoms, develop shortness of breath, life threatening emergency, suicidal or homicidal thoughts you must seek medical attention immediately by calling 911 or calling your MD immediately  if symptoms less severe.  You must read complete instructions/literature along with all the possible adverse reactions/side effects for all the Medicines you take and that have been prescribed to you. Take any new Medicines after you have completely understood and accpet all the possible adverse reactions/side effects.   Do not drive when taking Pain medications or sleeping medications (Benzodiazepines)  Do not take more than prescribed Pain, Sleep and Anxiety Medications. It is not advisable to combine anxiety,sleep and pain medications without talking with your primary care practitioner  Special Instructions: If you have smoked or chewed Tobacco  in the last 2 yrs please stop smoking, stop any regular Alcohol  and or any Recreational drug use.  Wear Seat belts while driving.  Do not drive if taking any narcotic, mind altering or controlled substances or recreational drugs or alcohol.

## 2020-07-10 NOTE — Plan of Care (Signed)
  Problem: Education: Goal: Knowledge of General Education information will improve Description: Including pain rating scale, medication(s)/side effects and non-pharmacologic comfort measures 07/10/2020 1239 by Cameron Ali, RN Outcome: Completed/Met 07/10/2020 0825 by Cameron Ali, RN Outcome: Progressing   Problem: Health Behavior/Discharge Planning: Goal: Ability to manage health-related needs will improve 07/10/2020 1239 by Cameron Ali, RN Outcome: Completed/Met 07/10/2020 0825 by Cameron Ali, RN Outcome: Progressing   Problem: Clinical Measurements: Goal: Ability to maintain clinical measurements within normal limits will improve 07/10/2020 1239 by Cameron Ali, RN Outcome: Completed/Met 07/10/2020 0825 by Cameron Ali, RN Outcome: Progressing Goal: Will remain free from infection 07/10/2020 1239 by Cameron Ali, RN Outcome: Completed/Met 07/10/2020 0825 by Cameron Ali, RN Outcome: Progressing Goal: Diagnostic test results will improve 07/10/2020 1239 by Cameron Ali, RN Outcome: Completed/Met 07/10/2020 0825 by Cameron Ali, RN Outcome: Progressing Goal: Respiratory complications will improve 07/10/2020 1239 by Cameron Ali, RN Outcome: Completed/Met 07/10/2020 0825 by Cameron Ali, RN Outcome: Progressing Goal: Cardiovascular complication will be avoided 07/10/2020 1239 by Cameron Ali, RN Outcome: Completed/Met 07/10/2020 0825 by Cameron Ali, RN Outcome: Progressing   Problem: Activity: Goal: Risk for activity intolerance will decrease 07/10/2020 1239 by Cameron Ali, RN Outcome: Completed/Met 07/10/2020 0825 by Cameron Ali, RN Outcome: Progressing   Problem: Nutrition: Goal: Adequate nutrition will be maintained 07/10/2020 1239 by Cameron Ali, RN Outcome: Completed/Met 07/10/2020 0825 by Cameron Ali, RN Outcome: Progressing   Problem: Coping: Goal: Level  of anxiety will decrease 07/10/2020 1239 by Cameron Ali, RN Outcome: Completed/Met 07/10/2020 0825 by Cameron Ali, RN Outcome: Progressing   Problem: Elimination: Goal: Will not experience complications related to bowel motility 07/10/2020 1239 by Cameron Ali, RN Outcome: Completed/Met 07/10/2020 0825 by Cameron Ali, RN Outcome: Progressing Goal: Will not experience complications related to urinary retention 07/10/2020 1239 by Cameron Ali, RN Outcome: Completed/Met 07/10/2020 0825 by Cameron Ali, RN Outcome: Progressing   Problem: Pain Managment: Goal: General experience of comfort will improve 07/10/2020 1239 by Cameron Ali, RN Outcome: Completed/Met 07/10/2020 0825 by Cameron Ali, RN Outcome: Progressing   Problem: Safety: Goal: Ability to remain free from injury will improve 07/10/2020 1239 by Cameron Ali, RN Outcome: Completed/Met 07/10/2020 0825 by Cameron Ali, RN Outcome: Progressing   Problem: Skin Integrity: Goal: Risk for impaired skin integrity will decrease 07/10/2020 1239 by Cameron Ali, RN Outcome: Completed/Met 07/10/2020 0825 by Cameron Ali, RN Outcome: Progressing

## 2020-07-10 NOTE — Plan of Care (Signed)

## 2020-07-10 NOTE — Progress Notes (Signed)
Spoke with patient on the phone about his new onset diabetes. Patient states that his mother and grandmother both have diabetes. He was starting to make diet changes before getting COVID+. States that he does not have a PCP. Discussed the HgbA1C of 7% and what it means over the 2-3 month period. Discussed what normal blood sugar should be. States that he will be going home on Metformin. Will need to eat  3 meals with taking Metformin. Discussed regulating consumption of sweet drinks and juices and making changes over the next few weeks with sugarfree drinks. Suggested that patient check blood sugars with meter. States that his grandmother checks blood sugars with meter from [employer], but encouraged him to get his own meter.   Ordered Living Well with Diabetes booklet, attached [employer] information on meters to discharge, asked staff RNs to teach patient how to check own blood sugars while in the hospital. Patient very receptive to information.   Will continue to monitor blood sugars while in the hospital.  Smith Mince RN BSN CDE Diabetes Coordinator Pager: [phone]  8am-5pm

## 2021-11-26 IMAGING — DX DG CHEST 2V
2 series · 2 of 2 positions shown · non-contrast
Comparison: 06/05/2008

CLINICAL DATA: Cough and chest congestion. Previous coronavirus
infection.

EXAM:
CHEST - 2 VIEW

[chest pa]
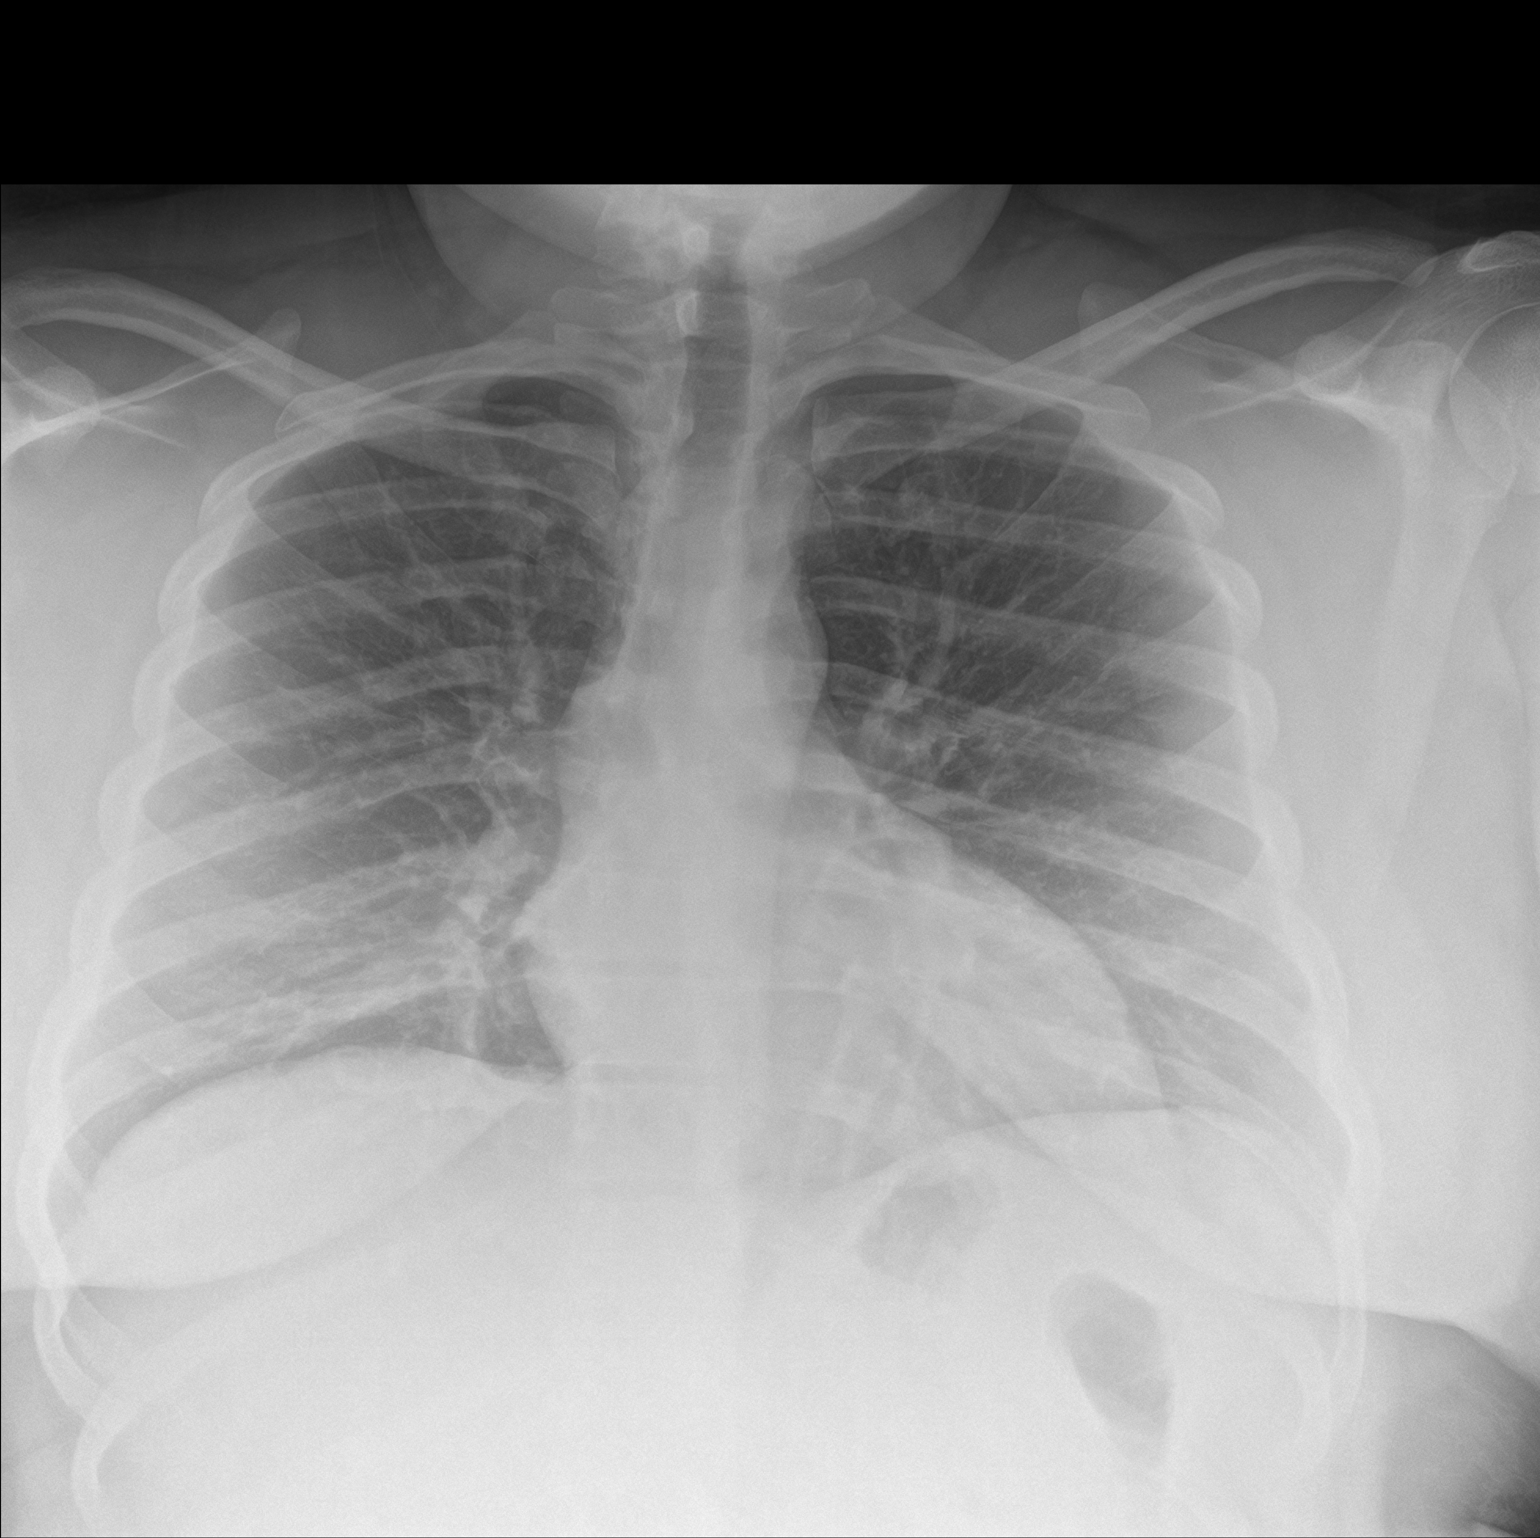

[chest lat]
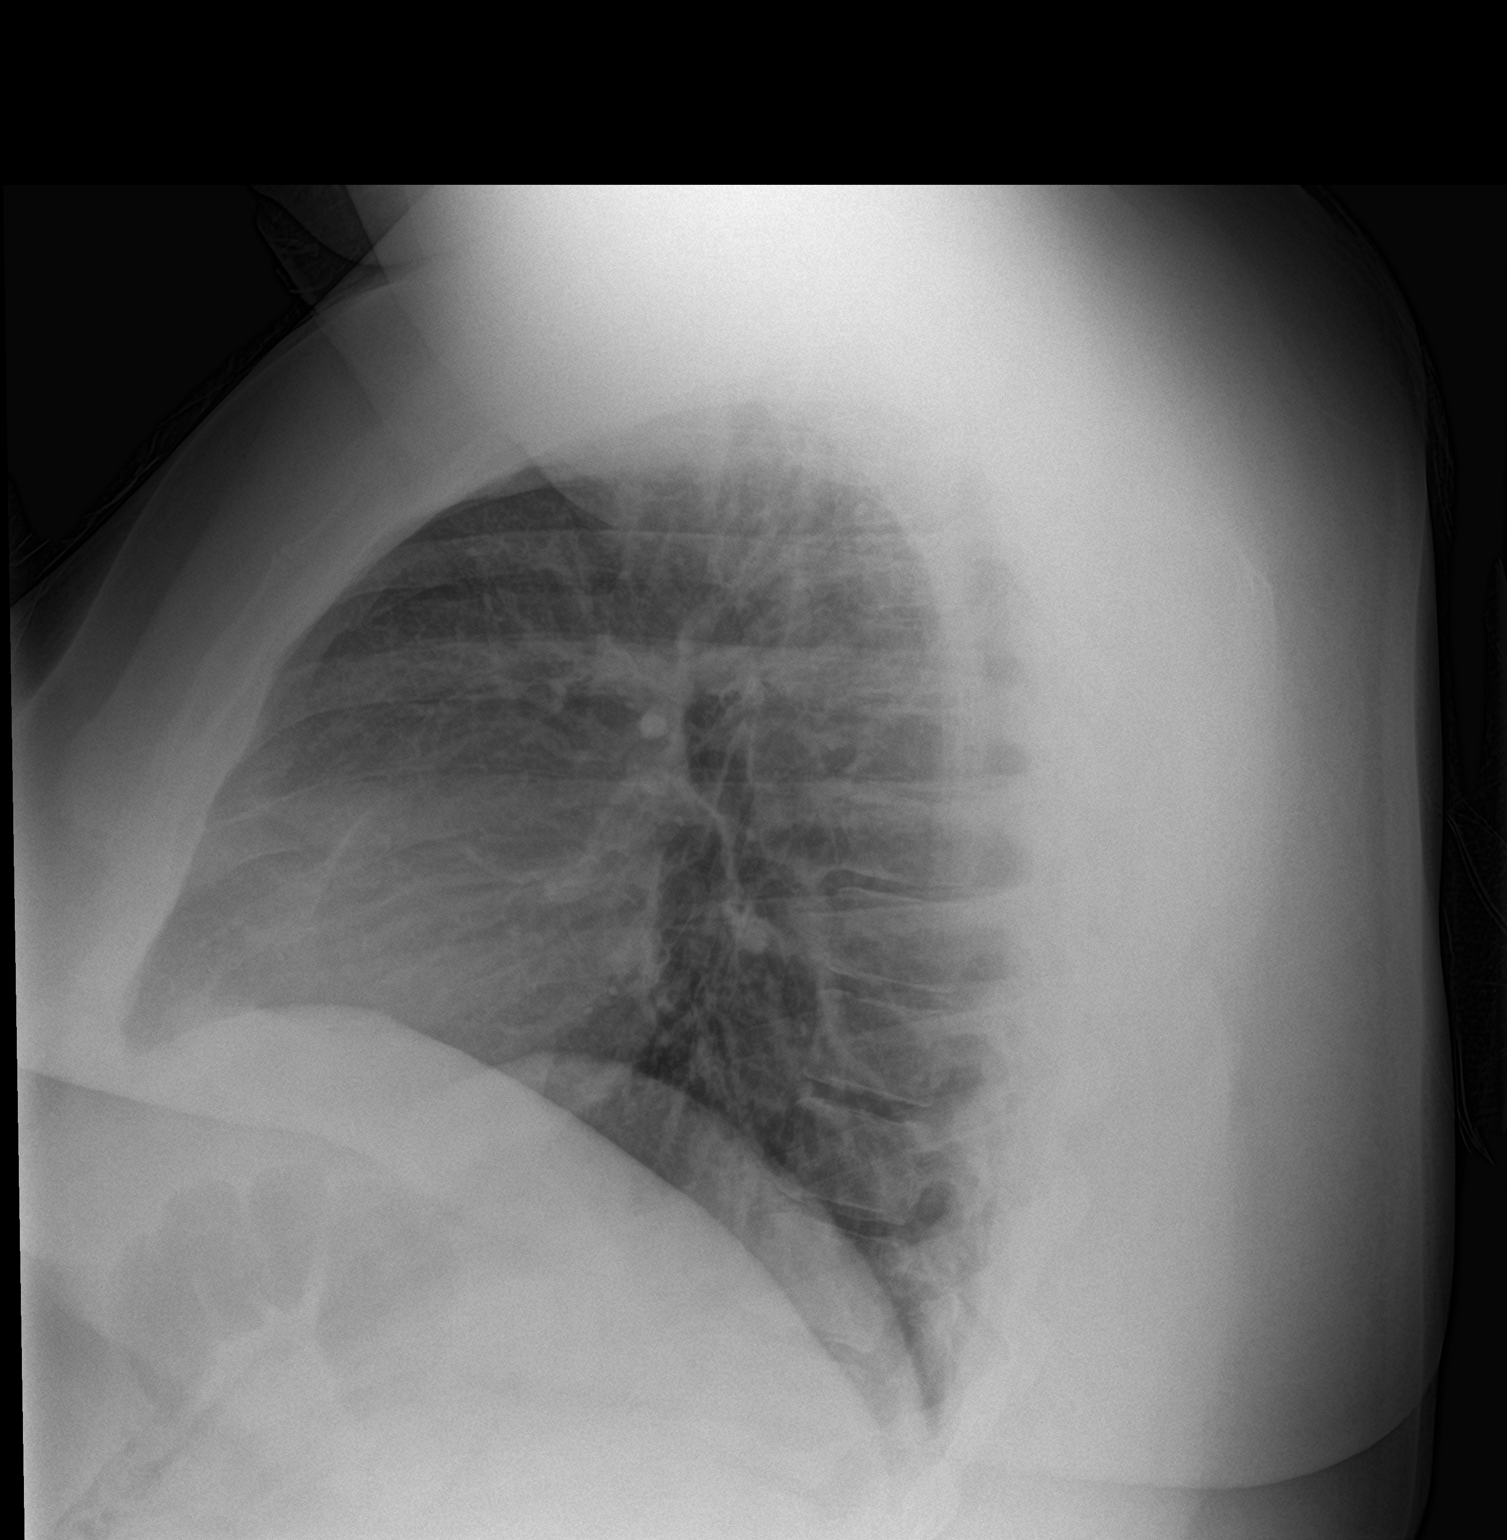

[2 of 2 positions shown; findings below may reference images not displayed]

FINDINGS: Heart size upper limits of normal. Mediastinal shadows are normal.
Mild atelectasis or scarring at the right lung base. Bronchial
thickening pattern suggesting bronchitis. No consolidation or lobar
collapse. No effusions. No significant finding.
IMPRESSION: Bronchial thickening pattern suggesting bronchitis. Mild atelectasis
or scarring at the right lung base.

## 2021-11-27 IMAGING — US US EXTREM LOW VENOUS
1 series · 13 of 24 positions shown · non-contrast
Comparison: None.

CLINICAL DATA: Bilateral lower extremity pain.



[Series 1: us extrem low venous · 0.12mm/px · 13 of 84 slices shown]
[im 1/84]
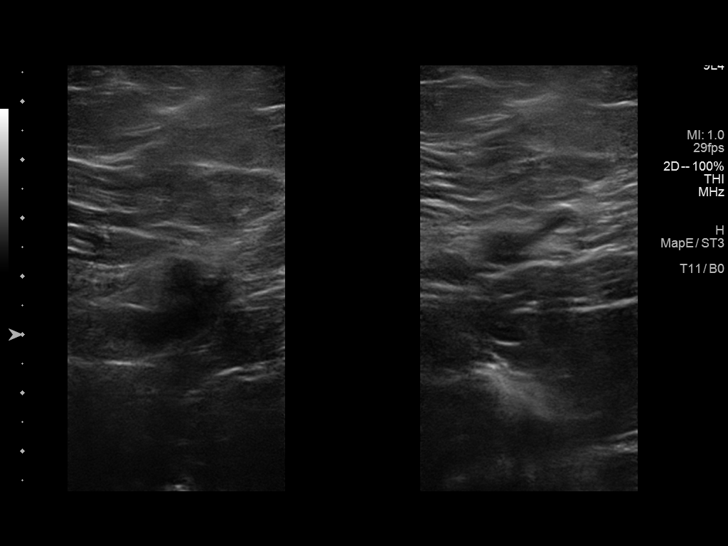
[im 8/84]
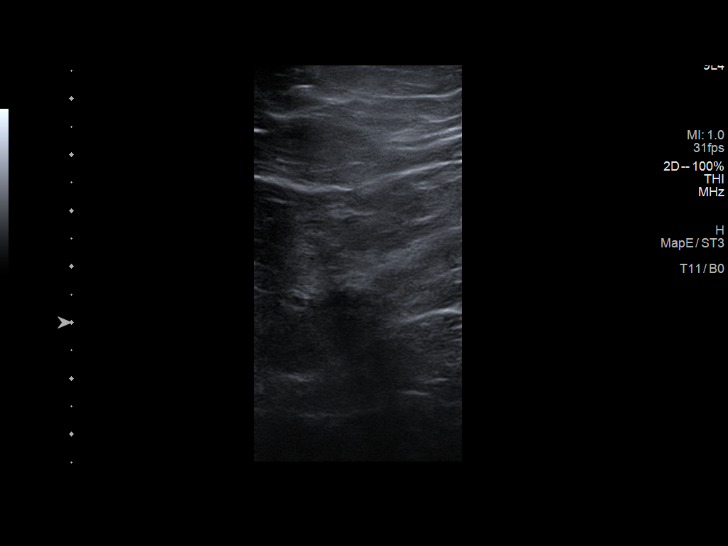
[im 15/84]
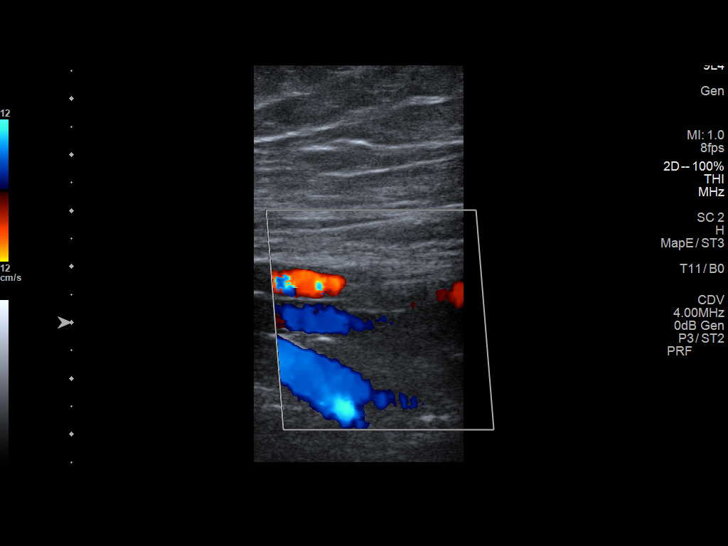
[im 22/84]
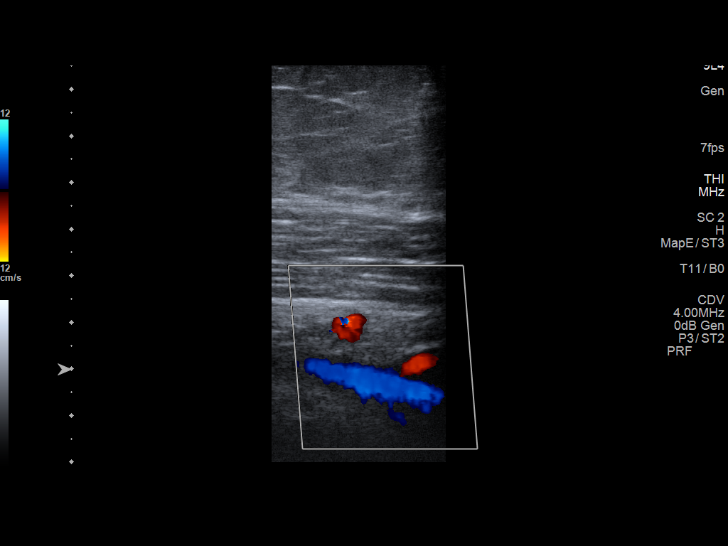
[im 29/84]
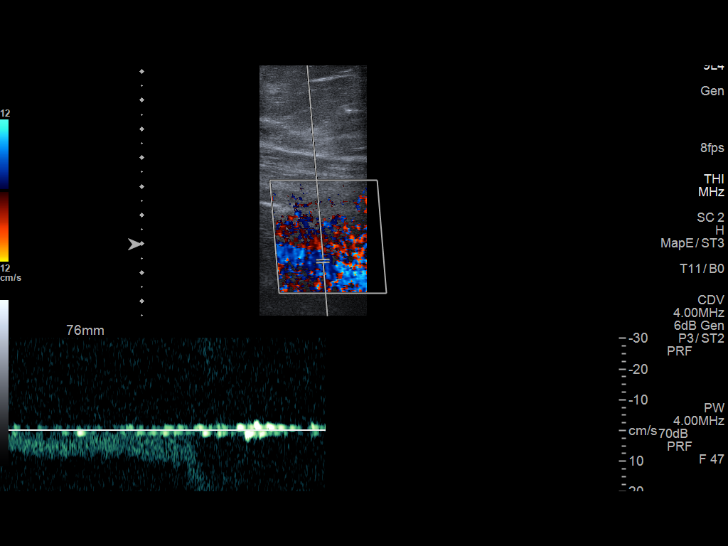
[im 37/84]
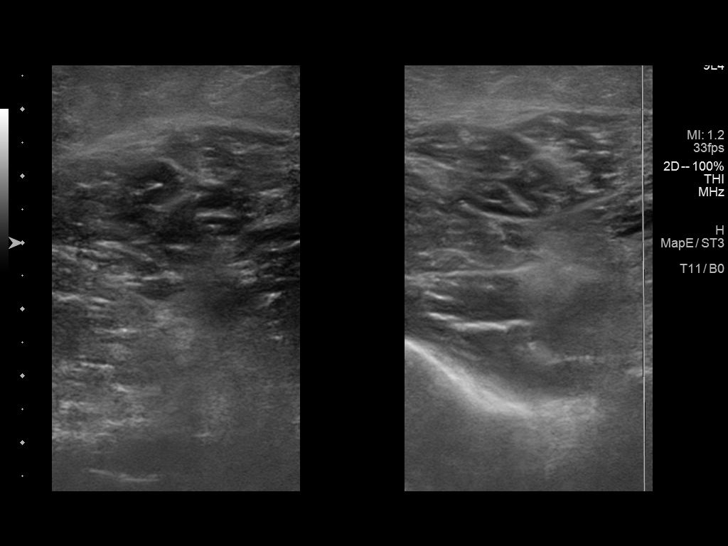
[im 44/84]
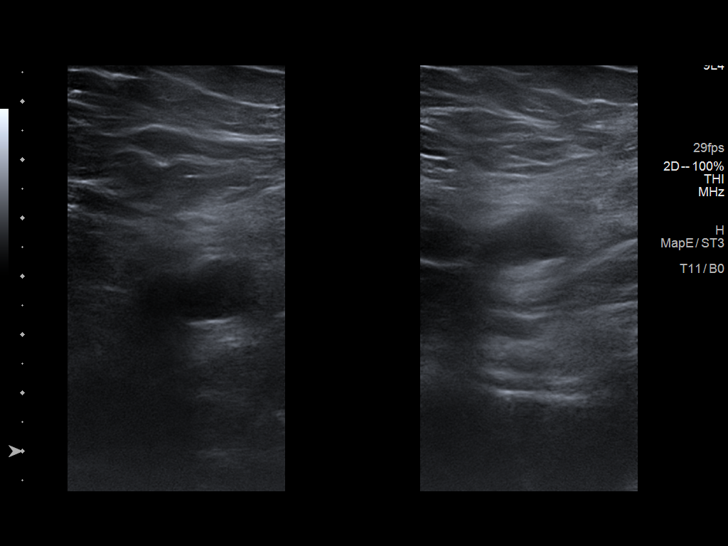
[im 47/84]
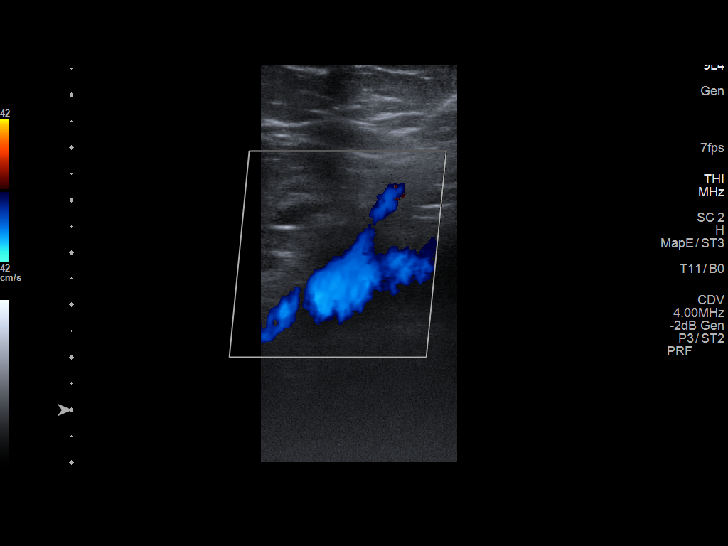
[im 55/84]
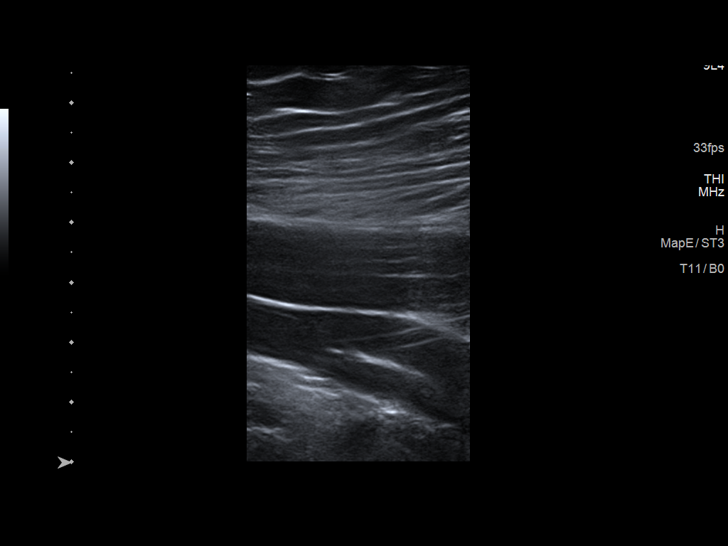
[im 62/84]
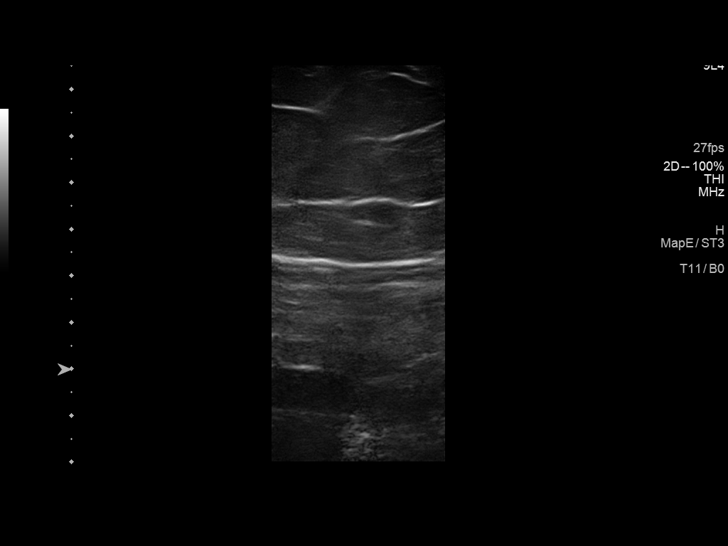
[im 69/84]
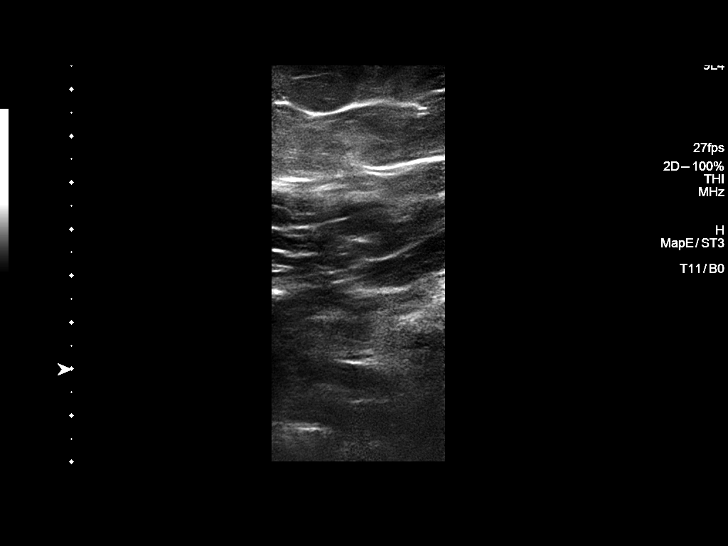
[im 76/84]
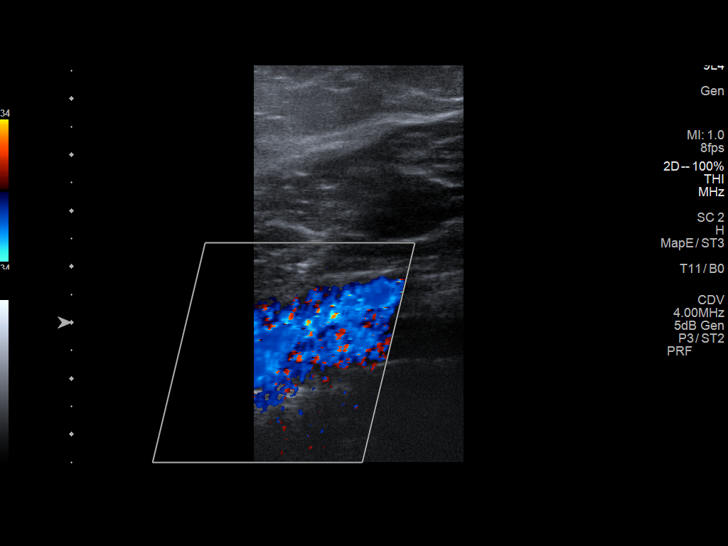
[im 84/84]
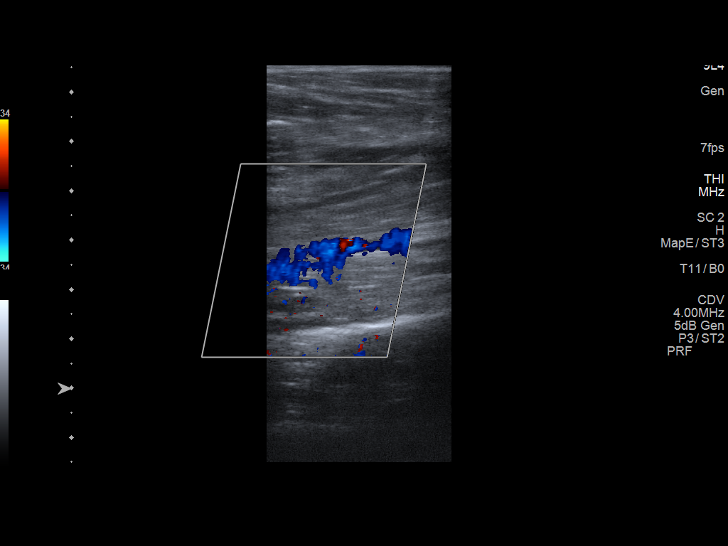

[13 of 24 positions shown; findings below may reference images not displayed]

FINDINGS: RIGHT LOWER EXTREMITY

Common Femoral Vein: No evidence of thrombus. Normal
compressibility, respiratory phasicity and response to augmentation.

Saphenofemoral Junction: No evidence of thrombus. Normal
compressibility and flow on color Doppler imaging.

Profunda Femoral Vein: No evidence of thrombus. Normal
compressibility and flow on color Doppler imaging.

Femoral Vein: No evidence of thrombus. Normal compressibility,
respiratory phasicity and response to augmentation.

Popliteal Vein: No evidence of thrombus. Normal compressibility,
respiratory phasicity and response to augmentation.

Calf Veins: No evidence of thrombus. Normal compressibility and flow
on color Doppler imaging.

Superficial Great Saphenous Vein: No evidence of thrombus. Normal
compressibility.

Venous Reflux:  None.

Other Findings:  None.

LEFT LOWER EXTREMITY

Common Femoral Vein: No evidence of thrombus. Normal
compressibility, respiratory phasicity and response to augmentation.

Saphenofemoral Junction: No evidence of thrombus. Normal
compressibility and flow on color Doppler imaging.

Profunda Femoral Vein: No evidence of thrombus. Normal
compressibility and flow on color Doppler imaging.

Femoral Vein: No evidence of thrombus. Normal compressibility,
respiratory phasicity and response to augmentation.

Popliteal Vein: No evidence of thrombus. Normal compressibility,
respiratory phasicity and response to augmentation.

Calf Veins: No evidence of thrombus. Normal compressibility and flow
on color Doppler imaging.

Superficial Great Saphenous Vein: No evidence of thrombus. Normal
compressibility.

Venous Reflux:  None.

Other Findings:  None.
IMPRESSION: No evidence of deep venous thrombosis in either lower extremity.
